# Patient Record
Sex: Male | Born: 1950 | Race: Black or African American | Hispanic: No | Marital: Married | Smoking: Former smoker
Health system: Southern US, Community
[De-identification: ages and names within clinical notes are randomized; demographics above are authoritative.]

## PROBLEM LIST (undated history)

## (undated) DIAGNOSIS — I219 Acute myocardial infarction, unspecified: Secondary | ICD-10-CM

## (undated) DIAGNOSIS — E119 Type 2 diabetes mellitus without complications: Secondary | ICD-10-CM

## (undated) DIAGNOSIS — K859 Acute pancreatitis without necrosis or infection, unspecified: Secondary | ICD-10-CM

## (undated) DIAGNOSIS — M549 Dorsalgia, unspecified: Secondary | ICD-10-CM

## (undated) DIAGNOSIS — G8929 Other chronic pain: Secondary | ICD-10-CM

## (undated) DIAGNOSIS — I10 Essential (primary) hypertension: Secondary | ICD-10-CM

## (undated) DIAGNOSIS — C73 Malignant neoplasm of thyroid gland: Secondary | ICD-10-CM

## (undated) DIAGNOSIS — I639 Cerebral infarction, unspecified: Secondary | ICD-10-CM

## (undated) HISTORY — PX: THYROIDECTOMY: SHX17

## (undated) HISTORY — PX: KNEE SURGERY: SHX244

## (undated) HISTORY — PX: PANCREAS SURGERY: SHX731

## (undated) HISTORY — PX: LUMBAR EPIDURAL INJECTION: SHX1980

## (undated) HISTORY — PX: ANGIOPLASTY: SHX39

---

## 2006-06-10 ENCOUNTER — Emergency Department (HOSPITAL_COMMUNITY): Admission: EM | Admit: 2006-06-10 | Discharge: 2006-06-10 | Payer: Self-pay | Admitting: Emergency Medicine

## 2006-07-02 ENCOUNTER — Emergency Department (HOSPITAL_COMMUNITY): Admission: EM | Admit: 2006-07-02 | Discharge: 2006-07-03 | Payer: Self-pay | Admitting: Emergency Medicine

## 2006-10-28 ENCOUNTER — Emergency Department (HOSPITAL_COMMUNITY): Admission: EM | Admit: 2006-10-28 | Discharge: 2006-10-28 | Payer: Self-pay | Admitting: *Deleted

## 2007-02-17 ENCOUNTER — Emergency Department (HOSPITAL_COMMUNITY): Admission: EM | Admit: 2007-02-17 | Discharge: 2007-02-17 | Payer: Self-pay | Admitting: *Deleted

## 2007-04-29 ENCOUNTER — Encounter
Admission: RE | Admit: 2007-04-29 | Discharge: 2007-07-28 | Payer: Self-pay | Admitting: Physical Medicine & Rehabilitation

## 2007-04-30 ENCOUNTER — Ambulatory Visit: Payer: Self-pay | Admitting: Physical Medicine & Rehabilitation

## 2007-06-12 ENCOUNTER — Ambulatory Visit: Payer: Self-pay | Admitting: Physical Medicine & Rehabilitation

## 2007-08-09 ENCOUNTER — Encounter
Admission: RE | Admit: 2007-08-09 | Discharge: 2007-11-07 | Payer: Self-pay | Admitting: Physical Medicine & Rehabilitation

## 2007-08-09 ENCOUNTER — Ambulatory Visit: Payer: Self-pay | Admitting: Physical Medicine & Rehabilitation

## 2007-09-11 ENCOUNTER — Ambulatory Visit: Payer: Self-pay | Admitting: Physical Medicine & Rehabilitation

## 2007-10-13 ENCOUNTER — Emergency Department (HOSPITAL_COMMUNITY): Admission: EM | Admit: 2007-10-13 | Discharge: 2007-10-13 | Payer: Self-pay | Admitting: Emergency Medicine

## 2007-10-14 ENCOUNTER — Ambulatory Visit: Payer: Self-pay | Admitting: Physical Medicine & Rehabilitation

## 2007-11-08 ENCOUNTER — Encounter
Admission: RE | Admit: 2007-11-08 | Discharge: 2007-11-12 | Payer: Self-pay | Admitting: Physical Medicine & Rehabilitation

## 2007-11-12 ENCOUNTER — Ambulatory Visit: Payer: Self-pay | Admitting: Physical Medicine & Rehabilitation

## 2007-12-08 ENCOUNTER — Emergency Department (HOSPITAL_COMMUNITY): Admission: EM | Admit: 2007-12-08 | Discharge: 2007-12-08 | Payer: Self-pay | Admitting: Emergency Medicine

## 2008-01-20 ENCOUNTER — Encounter
Admission: RE | Admit: 2008-01-20 | Discharge: 2008-01-21 | Payer: Self-pay | Admitting: Physical Medicine & Rehabilitation

## 2008-01-21 ENCOUNTER — Ambulatory Visit: Payer: Self-pay | Admitting: Physical Medicine & Rehabilitation

## 2008-04-10 ENCOUNTER — Encounter
Admission: RE | Admit: 2008-04-10 | Discharge: 2008-07-09 | Payer: Self-pay | Admitting: Physical Medicine & Rehabilitation

## 2008-04-14 ENCOUNTER — Ambulatory Visit: Payer: Self-pay | Admitting: Physical Medicine & Rehabilitation

## 2008-05-10 ENCOUNTER — Emergency Department (HOSPITAL_BASED_OUTPATIENT_CLINIC_OR_DEPARTMENT_OTHER): Admission: EM | Admit: 2008-05-10 | Discharge: 2008-05-10 | Payer: Self-pay | Admitting: Emergency Medicine

## 2008-05-31 ENCOUNTER — Emergency Department (HOSPITAL_BASED_OUTPATIENT_CLINIC_OR_DEPARTMENT_OTHER): Admission: EM | Admit: 2008-05-31 | Discharge: 2008-05-31 | Payer: Self-pay | Admitting: Emergency Medicine

## 2008-07-28 ENCOUNTER — Ambulatory Visit: Payer: Self-pay | Admitting: Physical Medicine & Rehabilitation

## 2008-07-28 ENCOUNTER — Encounter
Admission: RE | Admit: 2008-07-28 | Discharge: 2008-07-28 | Payer: Self-pay | Admitting: Physical Medicine & Rehabilitation

## 2008-10-14 ENCOUNTER — Ambulatory Visit: Payer: Self-pay | Admitting: Diagnostic Radiology

## 2008-10-14 ENCOUNTER — Emergency Department (HOSPITAL_BASED_OUTPATIENT_CLINIC_OR_DEPARTMENT_OTHER): Admission: EM | Admit: 2008-10-14 | Discharge: 2008-10-14 | Payer: Self-pay | Admitting: Emergency Medicine

## 2008-10-27 ENCOUNTER — Encounter
Admission: RE | Admit: 2008-10-27 | Discharge: 2008-10-29 | Payer: Self-pay | Admitting: Physical Medicine & Rehabilitation

## 2008-10-29 ENCOUNTER — Ambulatory Visit: Payer: Self-pay | Admitting: Physical Medicine & Rehabilitation

## 2009-01-06 ENCOUNTER — Emergency Department (HOSPITAL_BASED_OUTPATIENT_CLINIC_OR_DEPARTMENT_OTHER): Admission: EM | Admit: 2009-01-06 | Discharge: 2009-01-06 | Payer: Self-pay | Admitting: Emergency Medicine

## 2009-03-06 ENCOUNTER — Emergency Department (HOSPITAL_BASED_OUTPATIENT_CLINIC_OR_DEPARTMENT_OTHER): Admission: EM | Admit: 2009-03-06 | Discharge: 2009-03-06 | Payer: Self-pay | Admitting: Emergency Medicine

## 2009-03-22 ENCOUNTER — Encounter
Admission: RE | Admit: 2009-03-22 | Discharge: 2009-03-26 | Payer: Self-pay | Admitting: Physical Medicine & Rehabilitation

## 2009-03-26 ENCOUNTER — Ambulatory Visit: Payer: Self-pay | Admitting: Physical Medicine & Rehabilitation

## 2009-06-30 ENCOUNTER — Ambulatory Visit: Payer: Self-pay | Admitting: Diagnostic Radiology

## 2009-06-30 ENCOUNTER — Emergency Department (HOSPITAL_BASED_OUTPATIENT_CLINIC_OR_DEPARTMENT_OTHER): Admission: EM | Admit: 2009-06-30 | Discharge: 2009-06-30 | Payer: Self-pay | Admitting: Emergency Medicine

## 2009-08-09 ENCOUNTER — Ambulatory Visit: Payer: Self-pay | Admitting: Physical Medicine & Rehabilitation

## 2009-08-09 ENCOUNTER — Encounter
Admission: RE | Admit: 2009-08-09 | Discharge: 2009-11-07 | Payer: Self-pay | Admitting: Physical Medicine & Rehabilitation

## 2010-06-09 ENCOUNTER — Emergency Department (HOSPITAL_COMMUNITY): Admission: EM | Admit: 2010-06-09 | Discharge: 2010-06-09 | Payer: Self-pay | Admitting: Emergency Medicine

## 2010-06-11 ENCOUNTER — Emergency Department (HOSPITAL_BASED_OUTPATIENT_CLINIC_OR_DEPARTMENT_OTHER): Admission: EM | Admit: 2010-06-11 | Discharge: 2010-06-11 | Payer: Self-pay | Admitting: Emergency Medicine

## 2010-06-11 ENCOUNTER — Ambulatory Visit: Payer: Self-pay | Admitting: Radiology

## 2010-10-18 LAB — CBC
MCH: 23.6 pg — ABNORMAL LOW (ref 26.0–34.0)
MCH: 23.8 pg — ABNORMAL LOW (ref 26.0–34.0)
MCHC: 32 g/dL (ref 30.0–36.0)
MCV: 71 fL — ABNORMAL LOW (ref 78.0–100.0)
Platelets: 215 10*3/uL (ref 150–400)
Platelets: 217 10*3/uL (ref 150–400)
RBC: 5.55 MIL/uL (ref 4.22–5.81)
RDW: 14.6 % (ref 11.5–15.5)
RDW: 14.3 % (ref 11.5–15.5)

## 2010-10-18 LAB — POCT I-STAT, CHEM 8
BUN: 13 mg/dL (ref 6–23)
Calcium, Ion: 1.07 mmol/L — ABNORMAL LOW (ref 1.12–1.32)
Creatinine, Ser: 1.4 mg/dL (ref 0.4–1.5)
Glucose, Bld: 96 mg/dL (ref 70–99)
TCO2: 32 mmol/L (ref 0–100)

## 2010-10-18 LAB — BASIC METABOLIC PANEL
BUN: 16 mg/dL (ref 6–23)
CO2: 31 mEq/L (ref 19–32)
Calcium: 9.6 mg/dL (ref 8.4–10.5)
Creatinine, Ser: 1.3 mg/dL (ref 0.4–1.5)
GFR calc Af Amer: >60 mL/min (ref >60)
Glucose, Bld: 83 mg/dL (ref 70–99)

## 2010-10-18 LAB — POCT CARDIAC MARKERS: Myoglobin, poc: 92 ng/mL (ref 12–200)

## 2010-10-18 LAB — URINE MICROSCOPIC-ADD ON

## 2010-10-18 LAB — URINALYSIS, ROUTINE W REFLEX MICROSCOPIC
Leukocytes, UA: NEGATIVE
Nitrite: NEGATIVE
Specific Gravity, Urine: 1.022 (ref 1.005–1.030)
Urobilinogen, UA: 0.2 mg/dL (ref 0.0–1.0)

## 2010-10-18 LAB — DIFFERENTIAL
Basophils Absolute: 0.1 10*3/uL (ref 0.0–0.1)
Basophils Relative: 2 % — ABNORMAL HIGH (ref 0–1)
Eosinophils Absolute: 0.1 10*3/uL (ref 0.0–0.7)
Eosinophils Relative: 2 % (ref 0–5)
Lymphs Abs: 2.4 10*3/uL (ref 0.7–4.0)
Monocytes Relative: 11 % (ref 3–12)
Neutro Abs: 2.3 10*3/uL (ref 1.7–7.7)
Neutrophils Relative %: 41 % — ABNORMAL LOW (ref 43–77)

## 2010-10-18 LAB — HEPATIC FUNCTION PANEL
AST: 27 U/L (ref 0–37)
Bilirubin, Direct: 0.1 mg/dL (ref 0.0–0.3)
Total Bilirubin: 0.6 mg/dL (ref 0.3–1.2)

## 2010-10-18 LAB — LIPASE, BLOOD: Lipase: 34 U/L (ref 11–59)

## 2010-11-09 LAB — HEPATIC FUNCTION PANEL
Alkaline Phosphatase: 75 U/L (ref 39–117)
Bilirubin, Direct: 0 mg/dL (ref 0.0–0.3)
Indirect Bilirubin: 0.8 mg/dL (ref 0.3–0.9)
Total Protein: 8.9 g/dL — ABNORMAL HIGH (ref 6.0–8.3)

## 2010-11-09 LAB — BASIC METABOLIC PANEL
CO2: 28 mEq/L (ref 19–32)
Calcium: 9.7 mg/dL (ref 8.4–10.5)
Chloride: 98 mEq/L (ref 96–112)
Creatinine, Ser: 1.4 mg/dL (ref 0.4–1.5)
Glucose, Bld: 102 mg/dL — ABNORMAL HIGH (ref 70–99)
Sodium: 142 mEq/L (ref 135–145)

## 2010-11-09 LAB — POCT B-TYPE NATRIURETIC PEPTIDE (BNP): B Natriuretic Peptide, POC: 20 pg/mL (ref 0–100)

## 2010-11-09 LAB — CBC
Hemoglobin: 13.9 g/dL (ref 13.0–17.0)
MCHC: 32.3 g/dL (ref 30.0–36.0)
MCV: 76.6 fL — ABNORMAL LOW (ref 78.0–100.0)
RDW: 13.8 % (ref 11.5–15.5)

## 2010-11-09 LAB — DIFFERENTIAL
Basophils Absolute: 0.1 10*3/uL (ref 0.0–0.1)
Basophils Relative: 2 % — ABNORMAL HIGH (ref 0–1)
Eosinophils Absolute: 0.2 10*3/uL (ref 0.0–0.7)
Monocytes Absolute: 0.6 10*3/uL (ref 0.1–1.0)
Monocytes Relative: 9 % (ref 3–12)
Neutro Abs: 3.2 10*3/uL (ref 1.7–7.7)

## 2010-11-09 LAB — POCT CARDIAC MARKERS
CKMB, poc: 2.4 ng/mL (ref 1.0–8.0)
Myoglobin, poc: 164 ng/mL (ref 12–200)

## 2010-11-09 LAB — LIPASE, BLOOD: Lipase: 105 U/L (ref 23–300)

## 2010-11-13 LAB — CBC
HCT: 44.5 % (ref 39.0–52.0)
MCHC: 30.6 g/dL (ref 30.0–36.0)
MCV: 77.2 fL — ABNORMAL LOW (ref 78.0–100.0)
RBC: 5.77 MIL/uL (ref 4.22–5.81)
WBC: 5.3 10*3/uL (ref 4.0–10.5)

## 2010-11-13 LAB — LIPASE, BLOOD: Lipase: 81 U/L (ref 23–300)

## 2010-11-13 LAB — COMPREHENSIVE METABOLIC PANEL
AST: 42 U/L — ABNORMAL HIGH (ref 0–37)
BUN: 12 mg/dL (ref 6–23)
CO2: 34 mEq/L — ABNORMAL HIGH (ref 19–32)
Calcium: 9.7 mg/dL (ref 8.4–10.5)
Chloride: 97 mEq/L (ref 96–112)
Creatinine, Ser: 1.3 mg/dL (ref 0.4–1.5)
GFR calc Af Amer: >60 mL/min (ref >60)
GFR calc non Af Amer: 57 mL/min — ABNORMAL LOW (ref >60)
Total Bilirubin: 0.2 mg/dL — ABNORMAL LOW (ref 0.3–1.2)

## 2010-11-13 LAB — DIFFERENTIAL
Basophils Absolute: 0.1 10*3/uL (ref 0.0–0.1)
Eosinophils Relative: 2 % (ref 0–5)
Lymphocytes Relative: 47 % — ABNORMAL HIGH (ref 12–46)
Lymphs Abs: 2.5 10*3/uL (ref 0.7–4.0)
Neutro Abs: 2 10*3/uL (ref 1.7–7.7)
Neutrophils Relative %: 39 % — ABNORMAL LOW (ref 43–77)

## 2010-11-17 LAB — BASIC METABOLIC PANEL
BUN: 21 mg/dL (ref 6–23)
Chloride: 97 mEq/L (ref 96–112)
GFR calc Af Amer: >60 mL/min (ref >60)
GFR calc non Af Amer: 57 mL/min — ABNORMAL LOW (ref >60)
Potassium: 3.1 mEq/L — ABNORMAL LOW (ref 3.5–5.1)
Sodium: 140 mEq/L (ref 135–145)

## 2010-11-17 LAB — POCT CARDIAC MARKERS
Myoglobin, poc: 106 ng/mL (ref 12–200)
Myoglobin, poc: 91.3 ng/mL (ref 12–200)
Troponin i, poc: <0.05 ng/mL (ref 0.00–0.09)
Troponin i, poc: <0.05 ng/mL (ref 0.00–0.09)

## 2010-11-17 LAB — CBC
HCT: 41.2 % (ref 39.0–52.0)
MCV: 77.1 fL — ABNORMAL LOW (ref 78.0–100.0)
Platelets: 219 10*3/uL (ref 150–400)
RBC: 5.34 MIL/uL (ref 4.22–5.81)
WBC: 4.8 10*3/uL (ref 4.0–10.5)

## 2010-11-17 LAB — DIFFERENTIAL
Eosinophils Relative: 3 % (ref 0–5)
Lymphocytes Relative: 42 % (ref 12–46)
Lymphs Abs: 2.1 10*3/uL (ref 0.7–4.0)
Monocytes Relative: 12 % (ref 3–12)

## 2010-11-29 ENCOUNTER — Emergency Department (HOSPITAL_BASED_OUTPATIENT_CLINIC_OR_DEPARTMENT_OTHER)
Admission: EM | Admit: 2010-11-29 | Discharge: 2010-11-29 | Disposition: A | Discharge status: Home / Self Care | Payer: MEDICARE | Attending: Emergency Medicine | Admitting: Emergency Medicine

## 2010-11-29 DIAGNOSIS — F17200 Nicotine dependence, unspecified, uncomplicated: Secondary | ICD-10-CM | POA: Insufficient documentation

## 2010-11-29 DIAGNOSIS — I252 Old myocardial infarction: Secondary | ICD-10-CM | POA: Insufficient documentation

## 2010-11-29 DIAGNOSIS — R1013 Epigastric pain: Secondary | ICD-10-CM | POA: Insufficient documentation

## 2010-11-29 DIAGNOSIS — K861 Other chronic pancreatitis: Secondary | ICD-10-CM | POA: Insufficient documentation

## 2010-11-29 DIAGNOSIS — Z8679 Personal history of other diseases of the circulatory system: Secondary | ICD-10-CM | POA: Insufficient documentation

## 2010-11-29 DIAGNOSIS — I10 Essential (primary) hypertension: Secondary | ICD-10-CM | POA: Insufficient documentation

## 2010-11-29 LAB — COMPREHENSIVE METABOLIC PANEL
BUN: 14 mg/dL (ref 6–23)
Calcium: 8.6 mg/dL (ref 8.4–10.5)
Glucose, Bld: 99 mg/dL (ref 70–99)
Total Protein: 8 g/dL (ref 6.0–8.3)

## 2010-11-29 LAB — LIPASE, BLOOD: Lipase: 124 U/L (ref 23–300)

## 2010-11-29 LAB — CBC
HCT: 37.3 % — ABNORMAL LOW (ref 39.0–52.0)
MCHC: 34 g/dL (ref 30.0–36.0)
MCV: 67.6 fL — ABNORMAL LOW (ref 78.0–100.0)
Platelets: 195 10*3/uL (ref 150–400)
RDW: 15.4 % (ref 11.5–15.5)

## 2010-12-20 NOTE — Assessment & Plan Note (Signed)
Robert Meadows returns today.  He has chronic abdominal pain and chronic  pancreatitis but also improvement with trigger point injections  indicating a myofascial component to his pain.   INTERVAL MEDICAL HISTORY:  Left knee still doing okay after PT and  injection, however starting to get some left foot pain at the heel area.  He has seen podiatry in the past, has had foot orthosis in the past,  does not have one currently.   REVIEW OF SYSTEMS:  Has had some nausea and vomiting and abdominal pain  recently.   He does follow with his primary care physician as well.   VITAL SIGNS:  Currently his blood pressure is 156/90, pulse 69,  respiratory rate is 18, O2 saturation 99% in room air.  GENERAL:  In no acute distress, mood and affect appropriate.  ABDOMEN:  Has tenderness to palpation in the rectus abdominis area which  is exacerbated by his general health.  He otherwise has negative  guarding, rebound, no ascites noted.  No epigastric tenderness noted.  No lower quadrant tenderness noted.  LOWER EXTREMITIES:  In looking at his foot he has tenderness over the  plantar surface of the calcaneus.  He has a tight Achilles and has no  significant exacerbation of his pain with passive toe extension on the  left side.  No evident joint swelling, no foot edema, his foot is warm,  dry, no lesions.   IMPRESSION:  1. Chronic abdominal pain, a combination of chronic pancreatitis as      well as more of a rectus abdominis myofascial pain syndrome.  2. Left Achilles tendinitis.   PLAN:  1. Send to physical therapy for ultrasound as well as stretching and      assessment for foot orthosis versus heel cushion.  2. Trigger point injection today and once again, if temporarily      successful, he may benefit from botulinum toxin injection in the      rectus abdominis musculature.  3. Intrusive knee osteoarthritis, does not need a repeat injection at      this time.  Will follow up in 1  month.      Robert Meadows, M.D.  Electronically Signed     AEK/MedQ  D:  08/12/2007 16:27:41  T:  08/12/2007 20:23:07  Job #:  [id number]   cc:   Rocky Morel, M.D.

## 2010-12-20 NOTE — Procedures (Signed)
NAME:  Robert Meadows, Robert Meadows NO.:  [id number]   MEDICAL RECORD NO.:  [id number]          PATIENT TYPE:  REC   LOCATION:  [facility]                          FACILITY:  [facility]   PHYSICIAN:  [name], M.D.DATE OF BIRTH:  June 18, 1951   DATE OF PROCEDURE:  DATE OF DISCHARGE:                               OPERATIVE REPORT   PROCEDURE:  Right upper rectus abdominis botulinum toxin injection.   INDICATIONS:  Muscle spasm and pain, 728.85.  Pain is only partially  responsive to medication management, interferes with self-care mobility.  He has had temporary improvements with lidocaine a injection in the same  area lasting about 2 weeks.   Dilution of Botox is 50 units/mL, total medication 100 units.   Informed consent was obtained after describing risks and benefits of the  procedure to the patient.  These include bleeding, bruising, infection  as well as bowel perforation.  He elects to proceed and has given  written consent.  The patient placed supine on exam table.  The area of  maximal pain was palpated, upper right rectus abdominis area was marked  with marking pen and prepped with Betadine and alcohol, then entered  with a 26-gauge 1-inch needle electrode under needle EMG guidance.  Appropriate EMG activity obtained at each of two sites, 50 units were  injected into each site.  The patient tolerated the procedure well.  Post injection instructions given.  Return in 1 month for follow-up  visit.      [name], M.D.  Electronically Signed     AEK/MEDQ  D:  10/15/2007 14:06:06  T:  10/16/2007 14:22:11  Job:  [id number]

## 2010-12-20 NOTE — Group Therapy Note (Signed)
Consult requested for pain management for chronic abdominal pain due to  chronic pancreatitis.   HISTORY:  The patient is a 60 year old male who has had a fairly  constant stabbing and aching pain in his abdominal area since November  2006.  He states that he had a Thanksgiving meal and developed nausea  and vomiting.  He was reportedly admitted to the hospital and diagnosed  with pancreatitis.  I do not have records from that original  hospitalization.  He has been seen by several gastroenterologists,  including Dr. Harlen Meadows, who diagnosed a high probability of chronic  pancreatitis by endoscopy and endoscopic ultrasound.  He has had  ultrasound-guided celiac plexus block which was not helpful.  He has  another appointment with Dr. Margaretha Meadows next month.  He has had an ER visit  at [facility] following an ERCP on November 07, 2006 and had followup from his  celiac plexus block on February 04, 2007, and at that point it sounded like  although initially he had some increased pain after the celiac plexus  block, he ended up having a good result, and another one was performed  on February 15, 2007.   From a medication management standpoint, he has had hydrocodone  prescribed, and he takes this 2 tablets twice a day, which the patient  states is helpful when he takes this much, but he states that he usually  tries to stretch out his medicine and use 1 at a time, and supplements  with Tylenol.  He does note that he sometimes takes up to 12 Tylenol per  day, however.  His review of systems is positive for nausea, vomiting,  constipation, abdominal pain, poor appetite, as well as tremor.  His  functional status is independent.  He is able to climb steps.  He  drives.  He does not use any type of assistive device.   PAST MEDICAL HISTORY:  1. Hypertension.  2. Myocardial infarction.  3. Diabetes mellitus.  4. He has had a stroke in the past as well.   MEDICATIONS:  1. Imipramine 1 tablet per night.  2.  Clonidine 0.2 daily.  3. Zocor 10 mg a day.  4. Nitroglycerin p.r.n.  5. Dicyclomine 10 mg q.i.d.  6. Proctozone 2.5 twice a day.  7. Hydrocodone as needed 5/500.  8. Promethazine 25 mg as needed.  9. Klor-Con 20 mEq a day.  10.Isosorbide dinitrate 30 mg 1/2 tablet per day.  11.Atenolol 100 mg per day.  12.Furosemide 20 mg a day.  13.Nexium 40 mg a day.  14.Cozaar 100 mg a day.  15.Aspirin 81 mg a day.  16.Pangestyme t.i.d.   OTHER TREATING PHYSICIANS:  1. Dr. Judithe Modest, who is I believe his cardiologist.  2. Dr. Sherilyn Dacosta.   His last hydrocodone was taken April 28, 2007.  He states he did not  take his medicine today because he wanted to show me how bad he gets.  He has received oxycodone in the hospital.  This has been helpful in the  past.  He has had Dilaudid in the past, which was helpful, but he states  that morphine has not helped him.   PHYSICAL EXAMINATION:  VITAL SIGNS:  Blood pressure 190/120, pulse 82,  respiratory rate 18, O2 saturation 100% on room air.  GENERAL:  No acute distress.  Mood and affect appropriate.  MUSCULOSKELETAL:  His back has mild tenderness to palpation.  Neck has  full range of motion.  Lumbar spine has good range of  motion.  Upper and  lower extremity strength is normal.  Deep tendon reflexes are normal.  Gait is normal.  No toe drag or knee instability.  ABDOMEN:  Has mild upper quadrant tenderness to palpation, left greater  than right side, mainly around the costal margin.  He has no rebound or  guarding.  His bowel sounds are positive.  LUNGS:  Clear.  HEART:  Regular rate and rhythm.   IMPRESSION:  1. Chronic abdominal pain due to chronic pancreatitis.  Overall, I      think he has a good indication for narcotic analgesic medications.      However, we will need to check urine drug screen to assess for      concomitant illicit drug use or nondisclosed opiates potentially      from other sources.  This will take approximately a week or so  to      come back.  He has enough Vicodin, if he takes 2 tablets twice a      day, for another 8 days.  2. I have also prescribed outpatient PT for him twice a week x3 weeks.      He does have a myofascial component to his pain which is at the      costal margin origin of the rectus abdominis.  Will use lidocaine      as well for this, 2 patches on 12, off 12.  3. His blood pressure is elevated 190/120.  We called Dr. Irma Newness      office, who will see him today and further assess.  While his pain      may be a component of his elevated blood pressure, he also has a      history of hypertension.  He is on multiple medications, with prior      history of MI.      [name], M.D.  Electronically Signed     AEK/MedQ  D:  04/30/2007 16:36:38  T:  05/01/2007 11:13:30  Job #:  [id number]   cc:   Robert Meadows, M.D.  Fax [phone]   Robert Meadows, M.D.

## 2010-12-20 NOTE — Assessment & Plan Note (Signed)
A 60 year old male with stabbing aching pain, abdominal area since  November of 2006.  He was diagnosed with chronic pancreatitis.  Temporary result with ultrasound-guided celiac plexus spot seen by me in  initial evaluation in December.  Urine drug screen was appropriate.  No  evidence of illicit drug use.  No inappropriate opiate abuse.  He had  physical therapy for back pain and abdominal pain.  He has done well  with this and has finished up.  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   His knee pain is improved after injections on the left knee.   GENERAL:  No acute distress.  Mood and affect flat.  Average pain is in  the 3/10 level, which is up a little from last week was fair.  He had some nausea and vomiting the other day.  He has no further shoulder discomfort nor pain with range of motion in  his knee.  He has pain-free range of motion.  No evidence of perfusion.  Lower extremity strength is good.  BACK:  He has no tenderness to palpation.  ABDOMEN:  Positive bowel sounds and soft.  Nontender.  NECK:  Thyroid incision, transverse, healing well.   IMPRESSION:  1. Chronic abdominal pain, chronic pancreatitis.  Continue hydrocodone      5/500 b.i.d.  2. Left knee osteoarthritis with good results with injections.      Finished up with physical therapy.  Encouraged continued walking.   PLAN:  I will see him back in 1 month.  Continue the hydrocodone at  current dosage.  May have some exacerbation depending on his overall  health status.      [name], M.D.  Electronically Signed     AEK/MedQ  D:  06/13/2007 08:33:13  T:  06/13/2007 11:31:59  Job #:  [id number]   cc:   Dr. Rocky Morel  [place], [place]

## 2010-12-20 NOTE — Procedures (Signed)
NAME:  Robert Meadows, Robert Meadows NO.:  [id number]   MEDICAL RECORD NO.:  [id number]          PATIENT TYPE:  REC   LOCATION:  [facility]                          FACILITY:  [facility]   PHYSICIAN:  [name], M.D.DATE OF BIRTH:  1950-12-02   DATE OF PROCEDURE:  08/12/2007  DATE OF DISCHARGE:                               OPERATIVE REPORT   [name] returns today for trigger point injection.  He had an least  1 week of improvement after the right upper rectus abdominis was  injected on July 11, 2007.  His pain is only partially responsive to  medication management and other conservative care.  He has already gone  through physical therapy.   Informed consent was obtained after describing the risks and benefits of  the procedure to the patient.  These including bruising and infection.  He elects to proceed and has given consent.  The patient placed supine  on exam table.  Area marked and prepped with Betadine, entered with 25-  gauge inch and a half needle.  Three areas in the right upper rectus  abdominis at the midpoint mediolaterally marked, prepped with Betadine,  entered and injected with 1 mL of 1% lidocaine into each site.  The  patient tolerated the procedure well.  Pre and post injection  instructions given.      [name], M.D.  Electronically Signed     AEK/MEDQ  D:  08/12/2007 16:23:34  T:  08/13/2007 06:46:07  Job:  [id number]

## 2010-12-20 NOTE — Assessment & Plan Note (Signed)
The patient was last seen by me, January 21, 2008.  He has a history of  chronic pancreatitis, chronic abdominal pain, has responded to trigger-  point injection temporarily as well as Botox injection.  He has been  cleared for four injections for this year.  He has received one of them.  This is worn off.  He has had pain, only partial response to narcotic  analgesics as well as physical therapy.   His abdomen has some tenderness to right upper quadrant over the rectus  abdominis musculature.  No rebound, no guarding.  He is able to do  single and double leg raise, although this causes pain in his upper  abdominal area.   We discussed medications, he likes to resume his narcotic analgesics.  I  last gave him medication on January 15, 2008.  He states that he has been  in the hospital couple of times, received medicines post-hospitalization  as well.   His Oswestry disability index is 48%, putting him into moderate-to-  severe range.   IMPRESSION:  1. Chronic abdominal pain with partial relief with narcotic analgesic      medications, has had hospitalization x3 to 4 with chronic      pancreatitis with acute flare ups.  His last botulinum toxin      injection has been several months ago, in fact last one was      performed in March and he is about 6 months post.   We will repeat today.  1. Resumed hydrocodone.  2. Urine drug screen.      [name], M.D.  Electronically Signed     AEK/MedQ  D:  04/14/2008 13:51:10  T:  04/15/2008 03:30:41  Job #:  [id number]

## 2010-12-20 NOTE — Procedures (Signed)
NAME:  Robert Meadows, Robert Meadows NO.:  [id number]   MEDICAL RECORD NO.:  [id number]           PATIENT TYPE:   LOCATION:                                 FACILITY:   PHYSICIAN:  [name], M.D.DATE OF BIRTH:  1951/08/01   DATE OF PROCEDURE:  07/28/2008  DATE OF DISCHARGE:                               OPERATIVE REPORT   Botox injection, upper abdominal rectus on the right.   INDICATION:  Chronic muscle spasm with severe pain only partially  relieved by medication management and stretching.   Area was marked and prepped with Betadine, entered with 26-gauge, 50-mm  needle under EMG guidance.  Appropriate EMG activity obtained.  Botox 50  units were injected into each of 2 sites that were premarked and prepped  with Betadine.  Injection done after negative drawback for blood.  The  patient tolerated the procedure well.  We will repeat in 3 months and  has had good 53-month relief with prior injections.  Total units injected  100 units.  Informed consent was obtained.      [name], M.D.  Electronically Signed     AEK/MEDQ  D:  07/28/2008 16:10:96  T:  07/28/2008 20:40:08  Job:  [id number]

## 2010-12-20 NOTE — Procedures (Signed)
NAME:  DAYMIAN, LILL NO.:  [id number]   MEDICAL RECORD NO.:  [id number]          PATIENT TYPE:  REC   LOCATION:  [facility]                          FACILITY:  [facility]   PHYSICIAN:  [name], M.D.DATE OF BIRTH:  [birth year]   DATE OF PROCEDURE:  09/12/2007  DATE OF DISCHARGE:                               OPERATIVE REPORT   PROCEDURE PERFORMED:  Right rectus abdominis trigger point injection.   INDICATIONS:  Chronic muscle spasm with pain right upper rectus  abdominis.  The pain is only partially responsive to medication  management and other conservative care.  He has gone through physical  therapy and takes narcotic analgesics.  He had good relief for about two  weeks with a prior injection on August 12, 2007.  This resulted in not  having to use his hydrocodone or his Lidoderm patch.   PROCEDURE:  Informed consent was obtained after describing the risks and  benefits of the procedure to the patient.  These include bleeding,  bruising, infection. He elects to proceed and has given written consent.  The patient was placed supine on the exam table.  The area was marked  and prepped with Betadine. Entered with a 25 gauge 1 1/2 inch needle.  Three areas in the upper rectus abdominis marked, prepped with Betadine,  entered and injected with 1 mL of 1% lidocaine into each site in a fan  like pattern.  The patient tolerated the procedure well.  All injections  done after negative drawback for blood.  Pre and post injection  instructions given.   Should he have similar relief with this set of injections, I would  recommend botulinum toxin injection for more prolonged effect.      [name], M.D.  Electronically Signed     AEK/MEDQ  D:  09/12/2007 09:54:33  T:  09/12/2007 23:55:33  Job:  [id number]

## 2010-12-20 NOTE — Procedures (Signed)
NAME:  Robert Meadows, PORTNER NO.:  [id number]   MEDICAL RECORD NO.:  [id number]          PATIENT TYPE:  REC   LOCATION:  [facility]                          FACILITY:  [facility]   PHYSICIAN:  [name], M.D.DATE OF BIRTH:  10/27/1950   DATE OF PROCEDURE:  09/12/2007  DATE OF DISCHARGE:                               OPERATIVE REPORT   PROCEDURE:  Left knee intra-articular injection.   INDICATIONS:  Left knee osteoarthritis only partially relieved with  physical therapy and medication management. The pain interferes with  ambulation and contributes to right foot pain.  He has had previous good  relief with intra-articular injection of the knee which was last  performed May 15, 2007.   Informed consent was obtained after describing the risks and benefits to  the patient.  These include bleeding, bruising, infection, and he elects  to proceed and has given written consent. The patient was placed supine  on the exam table.  The area on the medial aspect of the knee was marked  and prepped with Betadine as well as alcohol, entered with a 25 gauge 1  1/2 inch needle.  A solution containing 1 mL of 40 mg/mL Kenalog plus 4  mL of 1% lidocaine were injected after negative drawback for blood.  The  patient tolerated the procedure well.  Post injection instructions  given.      [name], M.D.  Electronically Signed     AEK/MEDQ  D:  09/12/2007 09:32:53  T:  09/12/2007 21:48:32  Job:  [id number]

## 2010-12-20 NOTE — Assessment & Plan Note (Signed)
Robert Meadows follows up today.  He was last seen by me November 13, 2007.  He  did not follow through with physical therapy to do abdominal stretching  exercise, strengthening exercise but resumed his prior home exercise  program, which he thinks may be helping somewhat.  He has had an  exacerbation of his abdominal pain.  He was seen at ED by Dr. Dianne Dun.  He was seen on Dec 08, 2007, had a normal CMET, normal bili and alk phos,  normal lipase, normal cardiac enzyme, normal urine, and CT of the  abdomen showed no acute findings.  No ureteral calculi.  No CT evidence  for pancreatitis.  He was given prescription for oxycodone and  acetaminophen, which I was not aware of until after his visit with me  today.   His current meds from me include hydrocodone 5/500 one p.o. b.i.d.   PHYSICAL EXAMINATION:  ABDOMEN:  Positive bowel sounds.  Soft and  nontender to palpation.  No guarding.  No rebound.   He also complains of some heel pain.  We checked him for that.  He does  have evidence of plantar fasciitis with pain with stretching of the  plantar fascia as well as point tenderness over the calcaneus distal  aspect.   IMPRESSION:  1. Chronic pancreatitis with chronic abdominal pain.  2. Plantar fasciitis.   PLAN:  1. We will send him for orthotics for his plantar fasciitis.  2. We would like him to follow up with GI since he has not seen GI for      about 8 to 9 months.  I am not convinced, however, the last      flareup was actually pancreatitis.  3. He did violate his narcotic substance agreement.  We will check UDS      and consider discharge from clinic.      [name], M.D.  Electronically Signed     AEK/MedQ  D:  01/21/2008 16:56:55  T:  01/22/2008 09:22:30  Job #:  [id number]   cc:   Rocky Morel, MD  [place], [place]   Harlen Labs, MD  Department of GI  [facility]

## 2010-12-20 NOTE — Assessment & Plan Note (Signed)
A 60 year old male with stabbing aching pain in the abdominal area since  November 2006.  Diagnosed with chronic pancreatitis, only temporary  result with ultrasound guided celiac plexus block.  He was seen by me in  initial consultation approximately 2 weeks ago.  We checked urine drug  screen, and this turned out to be appropriate for the medications  listed.  No evidence of illicit drug use, or inappropriate opiate abuse.   Patient also has started with physical therapy for back pain and  abdominal pain, and they have been doing some core strengthening.   He returns today feeling as good as he has felt for a long time.  He did  have a colonoscopy in the interval time, polyp removed, and also a  thyroid biopsy.  He is not clear what the results are at this point.  His blood pressure was elevated last visit.  He has followed up with Dr.  Daryll Brod on this.   GENERAL:  In no acute distress.  Mood and affect appropriate.  His  average pain is about 2/10 currently.  It interferes with activity at  6/10 level.  Sleep is good.  Relief from medications is good.  He can  climb steps.  He drives.  He is independent with his self-care.   REVIEW OF SYSTEMS:  Positive for nausea, vomiting, constipation, and  reflux.   His blood pressure is 153/93.  Pulse 62.  Respirations 18.  His O2  saturation 100% on room air.  GENERAL:  In no acute distress.  Mood and affect appropriate.  Orientation x3.  Gait is normal.  BACK:  He has tenderness to palpation in the axial spine area in the  lumbar.  ABDOMEN:  Has no tenderness to palpation, and soft.  He can do single leg raises.  His left knee is painful to palpation  along the medial joint line.  He has crepitus at the knee, but good  range of motion on the left knee.  His lower extremity strength is  normal.   IMPRESSION:  1. Chronic abdominal pain, chronic pancreatitis.  Will continue      hydrocodone 5/500 b.i.d.  2. Left knee, history of  osteoarthritis.  Has had arthroscopy      demonstrating this in the past.  Has had good results with      injections.  I believe his physical therapy might be flaring up his      knee pain a bit, and we will do an injection today.  3. Chronic back pain.  This is fairly mild, and core strengthening      should help with this is as well.      [name], M.D.  Electronically Signed     AEK/MedQ  D:  05/16/2007 08:44:46  T:  05/16/2007 12:17:50  Job #:  [id number]   cc:   Rocky Morel, MD  [facility], [place]

## 2010-12-20 NOTE — Procedures (Signed)
NAME:  Robert Meadows, Robert Meadows NO.:  [id number]   MEDICAL RECORD NO.:  [id number]           PATIENT TYPE:   LOCATION:                                 FACILITY:   PHYSICIAN:  [name], M.D.DATE OF BIRTH:  Oct 14, 1950   DATE OF PROCEDURE:  DATE OF DISCHARGE:                               OPERATIVE REPORT   PROCEDURE:  Right rectus abdominus botulinum toxin injection on her  needle EMG guidance.   INDICATION:  Muscle-related pain, only partially relieved by medication  management and interfering with usual activities.   His right upper quadrant was marked and prepped with Betadine in 3  specific areas.  They were entered with 26-gauge 50-mm needle electrode  under needle EMG guidance.  Appropriate EMG activity was obtained, 100  units of Botox were divided between the 3 sites.  Dilution was 50  units/cc.  All injections done after negative drawback for blood and  after appropriate EMG activity was obtained.  Post injection  instructions given.  I will see him back in 3 months for repeat as this  has been his usual duration of effect based on prior experience.      [name], M.D.  Electronically Signed     AEK/MEDQ  D:  03/26/2009 16:36:42  T:  03/27/2009 07:38:14  Job:  [id number]

## 2010-12-20 NOTE — Procedures (Signed)
NAME:  Robert Meadows, Robert Meadows NO.:  [id number]   MEDICAL RECORD NO.:  [id number]          PATIENT TYPE:  REC   LOCATION:  [facility]                          FACILITY:  [facility]   PHYSICIAN:  [name], M.D.DATE OF BIRTH:  22-Jan-1951   DATE OF PROCEDURE:  05/16/2007  DATE OF DISCHARGE:                               OPERATIVE REPORT   PROCEDURE:  Left knee injection.   INDICATIONS FOR PROCEDURE:  Knee, OA, only partially responsive to  medication management and physical therapy.   Informed consent was obtained after describing the risks and benefits of  the procedure including bleeding, bruising, infection.  Medial approach  utilized.  Area marked, prepped with Betadine, frozen with methyl  fluoride, and then entered with 25-gauge 1-1/2-inch.  1 mL of 40 mg/mL  Depo-Medrol and 4 mL of 1% lidocaine injected.  The patient tolerated  procedure well.      [name], M.D.  Electronically Signed     AEK/MEDQ  D:  05/16/2007 08:45:47  T:  05/16/2007 13:40:23  Job:  [id number]

## 2010-12-20 NOTE — Procedures (Signed)
NAME:  Robert Meadows, Robert Meadows NO.:  [id number]   MEDICAL RECORD NO.:  [id number]          PATIENT TYPE:  REC   LOCATION:  [facility]                          FACILITY:  [facility]   PHYSICIAN:  [name], M.D.DATE OF BIRTH:  1951/03/22   DATE OF PROCEDURE:  07/11/2007  DATE OF DISCHARGE:                               OPERATIVE REPORT   PROCEDURE:  Trigger point injection right upper rectus abdominis,  sternal costal margin.   INDICATION:  Chronic abdominal pain, history of weeks of localized  tenderness in rectus abdominis muscles.   Informed consent was obtained after describing risks and benefits of  procedure including bleeding, bruising, infection.  The patient elects  proceed.  Areas marked on the inferior aspect of costal margin right  para midline rectus abdominis.  Prepped with Betadine and alcohol and  with 25 gauge 1-1/2 inch needle 1 mL 1% lidocaine injected into each  site after negative drawback for blood.  The patient tolerated procedure  well.  Post injection instructions given.  Return in 1 month.      [name], M.D.  Electronically Signed     AEK/MEDQ  D:  07/11/2007 08:56:10  T:  07/11/2007 10:19:58  Job:  [id number]

## 2010-12-20 NOTE — Assessment & Plan Note (Signed)
Office visit for left foot pain.  Has Achilles tendinitis.  Sent to  physical therapy. He is complaining about a 5/10 pain, but this subsides  after he is up walking.  He has had no lower extremity swelling, no  ankle pain, no numbness in the foot.   EXAMINATION:  Blood pressure 166/93, pulse 79, respiratory rate 18, O2  saturation 100% room air.  GENERAL:  No acute distress.  Overweight male.  Orientation x3.  Affect  bright and alert.  Gait shows no evidence of toe dragging or knee instability.  His foot  has good medial lateral stability, negative anterior drawer, negative  pain over the medial and lateral malleolar areas.  No pain with foot  inversion eversion, either passively or actively.  His ankle range of  motion is increased now to about 20 degrees.  His ankle dorsiflexion  strength has improved by one muscle grade.  He still has some tenderness  about 2 cm distal to the plantar surface of the calcaneus.  There is no  hypersensitivity to touch over that area, no foot swelling.   IMPRESSION:  Plantar fasciitis, improved.  Recommend that he continues  his last therapy session, continue his home exercise program.  If he has  residual pain after I see him again in one month, would send for foot  orthosis.      [name], M.D.  Electronically Signed     AEK/MedQ  D:  09/12/2007 09:56:58  T:  09/12/2007 21:10:52  Job #:  [id number]

## 2010-12-20 NOTE — Procedures (Signed)
Robert Meadows, Robert Meadows              ACCOUNT NO.:  [id number]   MEDICAL RECORD NO.:  [id number]          PATIENT TYPE:  REC   LOCATION:  [facility]                          FACILITY:  [facility]   PHYSICIAN:  [name], M.D.DATE OF BIRTH:  [birth year]   DATE OF PROCEDURE:  DATE OF DISCHARGE:                               OPERATIVE REPORT   This is a botulinum toxin injection, right rectus abdominis; chronic  abdominal pain with myofascial pain, muscle spasm, 72885.  The patient  has had that prior relief with botulinum toxin injection to the upper  abdomen area and has only had partial relief with medications and  physical therapy.   Informed consent was obtained after describing risks and benefits of  procedure with the patient.  These include bleeding, bruising, and  infection.  He elects to proceed and has given written consent.  Injection done under needle EMG guidance.  Two areas in the right upper  rectus abdominis marked, prepped with Betadine, entered with a 26-gauge  50-mm needle electrode under needle EMG guidance.  Positive EMG activity  obtained.  Then, 1 mL of solution containing 50 units/mL of botulinum  toxin type A was injected into each site.  The patient tolerated the  procedure well.  Post injection instructions given.      [name], M.D.  Electronically Signed     AEK/MEDQ  D:  04/14/2008 13:46:50  T:  04/15/2008 03:01:57  Job:  [id number]

## 2010-12-20 NOTE — Procedures (Signed)
NAME:  COLLIS, THEDE NO.:  [id number]   MEDICAL RECORD NO.:  [id number]          PATIENT TYPE:  REC   LOCATION:  [facility]                          FACILITY:  [facility]   PHYSICIAN:  [name], M.D.DATE OF BIRTH:  06-07-1951   DATE OF PROCEDURE:  10/29/2008  DATE OF DISCHARGE:                               OPERATIVE REPORT   The patient with chronic muscle spasm of the rectus muscle, March 03, 1984.  Botox injection last one was helpful for about 2-1/2 months.  This started to wear off.  This is under EMG guidance.   His pain is only partially relieved by medication management and  stretching and interferes with his usual activities.   Right upper quadrant was then marked, prepped with Betadine, three areas  were identified as being sore.  These were entered with a 26-gauge 50-mm  needle under EMG guidance.  Appropriate EMG activity was obtained.  50  units was injected into one site at the most medial site followed by 25  units injected at 2 separate sites more laterally.  The patient  tolerated procedure well.  All injections done after negative drawback  for blood and once appropriate EMG activity was obtained.  The patient  tolerated procedure well.  Post-injection instructions were given.      [name], M.D.  Electronically Signed     AEK/MEDQ  D:  10/29/2008 15:00:30  T:  10/30/2008 02:30:30  Job:  [id number]

## 2010-12-20 NOTE — Assessment & Plan Note (Signed)
A 60 year old male with stabbing, aching abdominal pain since November,  2006.  Diagnosed with chronic pancreatitis, temporary results with  ultrasound-guided celiac plexus block.  He has had appropriate urine  drug screens, no evidence of illicit drug use.  No compliance issues  with his current treatment plan.  He has gone through physical therapy  for back pain and abdominal pain.  He has done well with the left knee  injection for DJD.   Main complaint is abdominal pain which is up now to the 8/10 level, it  was down to 3/10 last time.  He has had no new medical problems but is  planning to have some thyroid radiation next month.  Pain interferes  with activity at a moderate level.  Pain worse with activity as well as  inactivity.  He is able to climb steps, he drives.  He has no ADL  difficulties.  He has nausea, vomiting, constipation at times due to his  pancreatitis.   His blood pressure is 122/72, pulse 64, respiratory rate is 18, O2 sat  is 100% on room air.  GENERAL:  No acute distress.  Mood and affect appropriate.  ABDOMEN:  Positive bowel sounds.  He has tenderness at the right upper  insertion of the rectus abdominis muscle.  He has no rebound, no lower  quadrant tenderness.  He is able to do single leg raises but has  difficulty with double leg raise.  His gait is normal.   IMPRESSION:  Chronic abdominal pain, chronic pancreatitis.  I believe he  has a myofascial component as well.   PLAN:  1. Will continue Lidoderm patch over the abdomen area.  2. Abdominal trigger point injections, consider Botox.  3. Discontinue hydrocodone 5/500 b.i.d.  4. In terms of the back, he is doing fairly well with this and in      terms of his knee he does not require any further reinjection.      [name], M.D.  Electronically Signed     AEK/MedQ  D:  07/11/2007 08:59:17  T:  07/11/2007 09:43:56  Job #:  [id number]   cc:   Dr. Daryll Brod

## 2011-01-03 ENCOUNTER — Emergency Department (HOSPITAL_BASED_OUTPATIENT_CLINIC_OR_DEPARTMENT_OTHER)
Admission: EM | Admit: 2011-01-03 | Discharge: 2011-01-03 | Disposition: A | Discharge status: Home / Self Care | Payer: Medicare Other | Attending: Emergency Medicine | Admitting: Emergency Medicine

## 2011-01-03 DIAGNOSIS — R1013 Epigastric pain: Secondary | ICD-10-CM | POA: Insufficient documentation

## 2011-01-03 DIAGNOSIS — I252 Old myocardial infarction: Secondary | ICD-10-CM | POA: Insufficient documentation

## 2011-01-03 DIAGNOSIS — I10 Essential (primary) hypertension: Secondary | ICD-10-CM | POA: Insufficient documentation

## 2011-01-03 DIAGNOSIS — Z8679 Personal history of other diseases of the circulatory system: Secondary | ICD-10-CM | POA: Insufficient documentation

## 2011-01-03 DIAGNOSIS — Z79899 Other long term (current) drug therapy: Secondary | ICD-10-CM | POA: Insufficient documentation

## 2011-01-03 DIAGNOSIS — K861 Other chronic pancreatitis: Secondary | ICD-10-CM | POA: Insufficient documentation

## 2011-01-03 LAB — URINALYSIS, ROUTINE W REFLEX MICROSCOPIC
Leukocytes, UA: NEGATIVE
Nitrite: NEGATIVE
Specific Gravity, Urine: 1.026 (ref 1.005–1.030)
pH: 5.5 (ref 5.0–8.0)

## 2011-01-03 LAB — COMPREHENSIVE METABOLIC PANEL
Alkaline Phosphatase: 59 U/L (ref 39–117)
BUN: 13 mg/dL (ref 6–23)
Glucose, Bld: 104 mg/dL — ABNORMAL HIGH (ref 70–99)
Potassium: 3.2 mEq/L — ABNORMAL LOW (ref 3.5–5.1)
Total Protein: 7.3 g/dL (ref 6.0–8.3)

## 2011-01-03 LAB — CBC
MCV: 69.1 fL — ABNORMAL LOW (ref 78.0–100.0)
Platelets: 238 10*3/uL (ref 150–400)
RBC: 4.99 MIL/uL (ref 4.22–5.81)
WBC: 6 10*3/uL (ref 4.0–10.5)

## 2011-01-03 LAB — DIFFERENTIAL
Basophils Relative: 2 % — ABNORMAL HIGH (ref 0–1)
Eosinophils Absolute: 0.1 10*3/uL (ref 0.0–0.7)
Lymphs Abs: 2.6 10*3/uL (ref 0.7–4.0)
Neutrophils Relative %: 39 % — ABNORMAL LOW (ref 43–77)

## 2011-01-03 LAB — LIPASE, BLOOD: Lipase: 33 U/L (ref 11–59)

## 2011-01-03 LAB — URINE MICROSCOPIC-ADD ON

## 2011-04-14 ENCOUNTER — Encounter: Payer: Self-pay | Admitting: Emergency Medicine

## 2011-04-14 ENCOUNTER — Emergency Department (INDEPENDENT_AMBULATORY_CARE_PROVIDER_SITE_OTHER): Payer: Medicare Other

## 2011-04-14 ENCOUNTER — Emergency Department (HOSPITAL_BASED_OUTPATIENT_CLINIC_OR_DEPARTMENT_OTHER)
Admission: EM | Admit: 2011-04-14 | Discharge: 2011-04-15 | Disposition: A | Discharge status: Home / Self Care | Payer: Medicare Other | Attending: Emergency Medicine | Admitting: Emergency Medicine

## 2011-04-14 DIAGNOSIS — R112 Nausea with vomiting, unspecified: Secondary | ICD-10-CM | POA: Insufficient documentation

## 2011-04-14 DIAGNOSIS — R109 Unspecified abdominal pain: Secondary | ICD-10-CM

## 2011-04-14 DIAGNOSIS — R1084 Generalized abdominal pain: Secondary | ICD-10-CM

## 2011-04-14 DIAGNOSIS — K5289 Other specified noninfective gastroenteritis and colitis: Secondary | ICD-10-CM | POA: Insufficient documentation

## 2011-04-14 DIAGNOSIS — R197 Diarrhea, unspecified: Secondary | ICD-10-CM | POA: Insufficient documentation

## 2011-04-14 DIAGNOSIS — N400 Enlarged prostate without lower urinary tract symptoms: Secondary | ICD-10-CM | POA: Insufficient documentation

## 2011-04-14 DIAGNOSIS — K529 Noninfective gastroenteritis and colitis, unspecified: Secondary | ICD-10-CM

## 2011-04-14 DIAGNOSIS — I700 Atherosclerosis of aorta: Secondary | ICD-10-CM

## 2011-04-14 HISTORY — DX: Acute myocardial infarction, unspecified: I21.9

## 2011-04-14 HISTORY — DX: Malignant neoplasm of thyroid gland: C73

## 2011-04-14 HISTORY — DX: Cerebral infarction, unspecified: I63.9

## 2011-04-14 HISTORY — DX: Essential (primary) hypertension: I10

## 2011-04-14 HISTORY — DX: Acute pancreatitis without necrosis or infection, unspecified: K85.90

## 2011-04-14 LAB — CBC
HCT: 38.5 % — ABNORMAL LOW (ref 39.0–52.0)
MCHC: 34.3 g/dL (ref 30.0–36.0)
Platelets: 286 10*3/uL (ref 150–400)
RDW: 14.8 % (ref 11.5–15.5)
WBC: 6.6 10*3/uL (ref 4.0–10.5)

## 2011-04-14 LAB — DIFFERENTIAL
Basophils Absolute: 0.1 10*3/uL (ref 0.0–0.1)
Eosinophils Absolute: 0.1 10*3/uL (ref 0.0–0.7)
Lymphocytes Relative: 53 % — ABNORMAL HIGH (ref 12–46)
Monocytes Relative: 12 % (ref 3–12)
Neutro Abs: 2.1 10*3/uL (ref 1.7–7.7)
Neutrophils Relative %: 32 % — ABNORMAL LOW (ref 43–77)

## 2011-04-14 LAB — COMPREHENSIVE METABOLIC PANEL
AST: 35 U/L (ref 0–37)
Albumin: 4 g/dL (ref 3.5–5.2)
Alkaline Phosphatase: 73 U/L (ref 39–117)
BUN: 15 mg/dL (ref 6–23)
Chloride: 97 mEq/L (ref 96–112)
Potassium: 4.6 mEq/L (ref 3.5–5.1)
Total Protein: 8.2 g/dL (ref 6.0–8.3)

## 2011-04-14 LAB — LIPASE, BLOOD: Lipase: 46 U/L (ref 11–59)

## 2011-04-14 MED ORDER — HYDROMORPHONE HCL 1 MG/ML IJ SOLN
INTRAMUSCULAR | Status: AC
  Administered 2011-04-14: 1 mg via INTRAVENOUS
  Filled 2011-04-14: qty 1

## 2011-04-14 MED ORDER — SODIUM CHLORIDE 0.9 % IV BOLUS (SEPSIS)
1000.0000 mL | Freq: Once | INTRAVENOUS | Status: AC
  Administered 2011-04-14: 1000 mL via INTRAVENOUS

## 2011-04-14 MED ORDER — HYDROMORPHONE HCL 1 MG/ML IJ SOLN
1.0000 mg | Freq: Once | INTRAMUSCULAR | Status: AC
  Administered 2011-04-14: 1 mg via INTRAVENOUS

## 2011-04-14 MED ORDER — ONDANSETRON HCL 4 MG/2ML IJ SOLN
4.0000 mg | Freq: Once | INTRAMUSCULAR | Status: AC
  Administered 2011-04-14: 4 mg via INTRAVENOUS

## 2011-04-14 MED ORDER — ONDANSETRON HCL 4 MG/2ML IJ SOLN
INTRAMUSCULAR | Status: AC
  Administered 2011-04-14: 4 mg via INTRAVENOUS
  Filled 2011-04-14: qty 2

## 2011-04-14 NOTE — ED Notes (Signed)
Pt c/o abd pain with n/v/d since lst sat. Pt reports symptoms consistent with previous pancreatitis

## 2011-04-15 MED ORDER — OXYCODONE-ACETAMINOPHEN 5-325 MG PO TABS
1.0000 | ORAL_TABLET | Freq: Once | ORAL | Status: AC
  Administered 2011-04-15: 1 via ORAL

## 2011-04-15 MED ORDER — ONDANSETRON HCL 4 MG PO TABS: 4.0000 mg | ORAL_TABLET | Freq: Four times a day (QID) | ORAL | Status: AC

## 2011-04-15 MED ORDER — OXYCODONE-ACETAMINOPHEN 5-325 MG PO TABS: 2.0000 | ORAL_TABLET | ORAL | Status: AC | PRN

## 2011-04-15 MED ORDER — HYDROMORPHONE HCL 1 MG/ML IJ SOLN
INTRAMUSCULAR | Status: AC
  Administered 2011-04-15: 1 mg via INTRAVENOUS
  Filled 2011-04-15: qty 1

## 2011-04-15 MED ORDER — HYDROMORPHONE HCL 1 MG/ML IJ SOLN
1.0000 mg | Freq: Once | INTRAMUSCULAR | Status: AC
  Administered 2011-04-15: 1 mg via INTRAVENOUS

## 2011-04-15 MED ORDER — OXYCODONE-ACETAMINOPHEN 5-325 MG PO TABS
ORAL_TABLET | ORAL | Status: AC
  Administered 2011-04-15: 1 via ORAL
  Filled 2011-04-15: qty 1

## 2011-04-15 NOTE — ED Provider Notes (Signed)
History     CSN: [id number] Arrival date & time: 04/14/2011  9:12 PM  Chief Complaint  Patient presents with  . Abdominal Pain  . Nausea  . Emesis  . Diarrhea   Patient is a 60 y.o. male presenting with abdominal pain, vomiting, and diarrhea. The history is provided by the patient and the spouse. No language interpreter was used.  Abdominal Pain The primary symptoms of the illness include abdominal pain, vomiting and diarrhea. The primary symptoms of the illness do not include fever, fatigue, shortness of breath, hematemesis, hematochezia or dysuria. The current episode started more than 2 days ago (a week ago saturday). The onset of the illness was gradual. The problem has not changed since onset. The patient states that she believes she is currently not pregnant. The patient has not had a change in bowel habit. Risk factors for an acute abdominal problem include being elderly. Symptoms associated with the illness do not include chills, anorexia, diaphoresis, heartburn, constipation, urgency, hematuria, frequency or back pain. Significant associated medical issues do not include PUD, GERD, inflammatory bowel disease, diabetes, sickle cell disease, gallstones, liver disease, substance abuse, diverticulitis, HIV or cardiac disease.  Emesis  Associated symptoms include abdominal pain and diarrhea. Pertinent negatives include no chills and no fever.  Diarrhea The primary symptoms include abdominal pain, vomiting and diarrhea. Primary symptoms do not include fever, fatigue, hematemesis, hematochezia or dysuria.  The illness does not include chills, anorexia, constipation or back pain. Associated medical issues do not include inflammatory bowel disease, GERD, gallstones, liver disease, PUD or diverticulitis.    Past Medical History  Diagnosis Date  . Hypertension   . Pancreatitis   . Myocardial infarct   . Thyroid cancer   . Stroke     Past Surgical History  Procedure Date  . Thyroidectomy    . Pancreas surgery   . Knee surgery   . Lumbar epidural injection     No family history on file.  History  Substance Use Topics  . Smoking status: Current Everyday Smoker  . Smokeless tobacco: Not on file  . Alcohol Use: No      Review of Systems  Constitutional: Negative for fever, chills, diaphoresis and fatigue.  HENT: Negative for facial swelling.   Eyes: Negative for discharge.  Respiratory: Negative for shortness of breath.   Cardiovascular: Negative for chest pain.  Gastrointestinal: Positive for vomiting, abdominal pain and diarrhea. Negative for heartburn, constipation, hematochezia, abdominal distention, anorexia and hematemesis.  Genitourinary: Negative for dysuria, urgency, frequency and hematuria.  Musculoskeletal: Negative for back pain.  Neurological: Negative for dizziness.  Hematological: Negative.   Psychiatric/Behavioral: Negative.     Physical Exam  BP 124/63  Pulse 68  Temp(Src) 98.3 F (36.8 C) (Oral)  Resp 18  SpO2 100%  Physical Exam  Constitutional: He is oriented to person, place, and time. He appears well-developed and well-nourished. No distress.  HENT:  Head: Normocephalic and atraumatic.  Eyes: EOM are normal. Pupils are equal, round, and reactive to light.  Neck: Normal range of motion. Neck supple.  Cardiovascular: Normal rate and regular rhythm.   Pulmonary/Chest: Effort normal and breath sounds normal.  Abdominal: Soft. Bowel sounds are normal. He exhibits no distension and no mass. There is tenderness. There is no rebound and no guarding.  Musculoskeletal: Normal range of motion. He exhibits no edema.  Neurological: He is alert and oriented to person, place, and time.  Skin: Skin is warm and dry.  Psychiatric: He has a normal  mood and affect.    ED Course  Procedures  MDM Return for fevers, chills, inability to tolerate food or drink, inability to pass stool or gas or worsening pain.  Follow up with your family doctor on  Monday and have follow up outpatient PSA test for prostate gland.  Patient verbalizes understanding and agrees to follow up     Seng Larch Smitty Cords, MD 04/15/11 [phone]

## 2011-04-15 NOTE — ED Notes (Signed)
Pt verbalized understanding to return immediately to the ER if he develops an inability to pass stool and vomiting. Pt also verbalized understanding to f/u with urology in reference to his prostate being enlarged.

## 2011-05-01 LAB — CBC
Platelets: 226
RDW: 15.5
WBC: 5.3

## 2011-05-01 LAB — LIPASE, BLOOD: Lipase: 23

## 2011-05-01 LAB — URINALYSIS, ROUTINE W REFLEX MICROSCOPIC
Bilirubin Urine: NEGATIVE
Glucose, UA: NEGATIVE
Leukocytes, UA: NEGATIVE
Nitrite: NEGATIVE
Specific Gravity, Urine: 1.03
pH: 6

## 2011-05-01 LAB — URINE MICROSCOPIC-ADD ON

## 2011-05-01 LAB — DIFFERENTIAL
Basophils Relative: 1
Eosinophils Absolute: 0.1
Eosinophils Relative: 2
Monocytes Absolute: 0.5
Monocytes Relative: 9

## 2011-05-01 LAB — COMPREHENSIVE METABOLIC PANEL
ALT: 31
AST: 31
Albumin: 3.5
Alkaline Phosphatase: 48
Potassium: 4.2
Sodium: 141
Total Protein: 6.8

## 2011-05-08 LAB — GLUCOSE, CAPILLARY: Glucose-Capillary: 162 — ABNORMAL HIGH

## 2011-05-09 LAB — COMPREHENSIVE METABOLIC PANEL
ALT: 45
AST: 41 — ABNORMAL HIGH
AST: 52 — ABNORMAL HIGH
Albumin: 4.7
Albumin: 5
Alkaline Phosphatase: 68
Alkaline Phosphatase: 69
BUN: 21
Chloride: 92 — ABNORMAL LOW
Chloride: 94 — ABNORMAL LOW
GFR calc Af Amer: 51 — ABNORMAL LOW
GFR calc Af Amer: 51 — ABNORMAL LOW
Potassium: 3.1 — ABNORMAL LOW
Potassium: 3.6
Sodium: 142
Total Bilirubin: 0.6
Total Protein: 9 — ABNORMAL HIGH

## 2011-05-09 LAB — DIFFERENTIAL
Basophils Absolute: 0
Basophils Relative: 1
Basophils Relative: 1
Eosinophils Absolute: 0.1
Eosinophils Absolute: 0.2
Eosinophils Relative: 3
Eosinophils Relative: 3
Monocytes Absolute: 0.6
Monocytes Absolute: 0.9
Monocytes Relative: 16 — ABNORMAL HIGH
Neutro Abs: 1.9

## 2011-05-09 LAB — URINALYSIS, ROUTINE W REFLEX MICROSCOPIC
Bilirubin Urine: NEGATIVE
Glucose, UA: NEGATIVE
Protein, ur: NEGATIVE
Protein, ur: NEGATIVE
Urobilinogen, UA: 0.2

## 2011-05-09 LAB — URINE MICROSCOPIC-ADD ON

## 2011-05-09 LAB — CBC
HCT: 40.3
Platelets: 219
Platelets: 224
RBC: 5.2
RDW: 14.8
WBC: 4.4
WBC: 5.4

## 2011-05-23 LAB — COMPREHENSIVE METABOLIC PANEL
AST: 30
Alkaline Phosphatase: 63
CO2: 24
Chloride: 102
Creatinine, Ser: 1.36
GFR calc Af Amer: >60
GFR calc non Af Amer: 55 — ABNORMAL LOW
Total Bilirubin: 0.3

## 2011-05-23 LAB — CBC
HCT: 44
MCV: 75.3 — ABNORMAL LOW
RBC: 5.84 — ABNORMAL HIGH
WBC: 7.5

## 2011-05-23 LAB — DIFFERENTIAL
Basophils Absolute: 0
Basophils Relative: 0
Eosinophils Absolute: 0
Eosinophils Relative: 0
Lymphocytes Relative: 25

## 2011-05-23 LAB — LIPASE, BLOOD: Lipase: 19

## 2011-07-14 ENCOUNTER — Emergency Department (HOSPITAL_BASED_OUTPATIENT_CLINIC_OR_DEPARTMENT_OTHER)
Admission: EM | Admit: 2011-07-14 | Discharge: 2011-07-14 | Disposition: A | Discharge status: Home / Self Care | Payer: Medicare Other | Attending: Emergency Medicine | Admitting: Emergency Medicine

## 2011-07-14 ENCOUNTER — Emergency Department (INDEPENDENT_AMBULATORY_CARE_PROVIDER_SITE_OTHER): Payer: Medicare Other

## 2011-07-14 ENCOUNTER — Encounter (HOSPITAL_BASED_OUTPATIENT_CLINIC_OR_DEPARTMENT_OTHER): Payer: Self-pay

## 2011-07-14 DIAGNOSIS — R51 Headache: Secondary | ICD-10-CM | POA: Insufficient documentation

## 2011-07-14 DIAGNOSIS — J111 Influenza due to unidentified influenza virus with other respiratory manifestations: Secondary | ICD-10-CM | POA: Insufficient documentation

## 2011-07-14 DIAGNOSIS — R509 Fever, unspecified: Secondary | ICD-10-CM

## 2011-07-14 DIAGNOSIS — R0989 Other specified symptoms and signs involving the circulatory and respiratory systems: Secondary | ICD-10-CM

## 2011-07-14 DIAGNOSIS — Z79899 Other long term (current) drug therapy: Secondary | ICD-10-CM | POA: Insufficient documentation

## 2011-07-14 DIAGNOSIS — I10 Essential (primary) hypertension: Secondary | ICD-10-CM | POA: Insufficient documentation

## 2011-07-14 DIAGNOSIS — R05 Cough: Secondary | ICD-10-CM

## 2011-07-14 MED ORDER — ACETAMINOPHEN 325 MG PO TABS
650.0000 mg | ORAL_TABLET | Freq: Once | ORAL | Status: AC
  Administered 2011-07-14: 650 mg via ORAL
  Filled 2011-07-14: qty 2

## 2011-07-14 MED ORDER — HYDROCODONE-ACETAMINOPHEN 5-500 MG PO TABS: 1.0000 | ORAL_TABLET | Freq: Four times a day (QID) | ORAL | Status: AC | PRN

## 2011-07-14 NOTE — ED Notes (Signed)
Chills fever cough since yesterday. Has been taking Tylenol and otc flu and cold medication.

## 2011-07-14 NOTE — ED Provider Notes (Signed)
History     CSN: [id number] Arrival date & time: 07/14/2011  6:22 PM   First MD Initiated Contact with Patient 07/14/11 1831      Chief Complaint  Patient presents with  . Cough  . Fever  . Headache    (Consider location/radiation/quality/duration/timing/severity/associated sxs/prior treatment) Patient is a 60 y.o. male presenting with cough, fever, and headaches. The history is provided by the patient. No language interpreter was used.  Cough This is a new problem. The current episode started yesterday. The problem occurs constantly. The problem has not changed since onset.The cough is non-productive. The maximum temperature recorded prior to his arrival was 101 to 101.9 F. Associated symptoms include headaches, sore throat and myalgias. Pertinent negatives include no shortness of breath. He has tried decongestants for the symptoms. He is a smoker.  Fever Primary symptoms of the febrile illness include fever, headaches, cough and myalgias. Primary symptoms do not include shortness of breath.  Headache  Associated symptoms include a fever. Pertinent negatives include no shortness of breath.    Past Medical History  Diagnosis Date  . Hypertension   . Pancreatitis   . Myocardial infarct   . Thyroid cancer   . Stroke     Past Surgical History  Procedure Date  . Thyroidectomy   . Pancreas surgery   . Knee surgery   . Lumbar epidural injection     No family history on file.  History  Substance Use Topics  . Smoking status: Current Everyday Smoker  . Smokeless tobacco: Not on file  . Alcohol Use: No      Review of Systems  Constitutional: Positive for fever.  HENT: Positive for sore throat.   Respiratory: Positive for cough. Negative for shortness of breath.   Musculoskeletal: Positive for myalgias.  Neurological: Positive for headaches.  All other systems reviewed and are negative.    Allergies  Ivp dye and Spinach  Home Medications   Current Outpatient  Rx  Name Route Sig Dispense Refill  . ALBUTEROL 90 MCG/ACT IN AERS Inhalation Inhale 2 puffs into the lungs every 6 (six) hours as needed. Shortness of breath and wheezing     . AMLODIPINE BESYLATE 10 MG PO TABS Oral Take 10 mg by mouth daily.      . ASPIRIN 325 MG PO TBEC Oral Take 325 mg by mouth daily.      . ATENOLOL 100 MG PO TABS Oral Take 100 mg by mouth daily.      . ATORVASTATIN CALCIUM 20 MG PO TABS Oral Take 20 mg by mouth at bedtime.      Marland Kitchen CLONIDINE HCL 0.2 MG PO TABS Oral Take 0.2 mg by mouth at bedtime.      . CLOPIDOGREL BISULFATE 75 MG PO TABS Oral Take 75 mg by mouth daily.      Marland Kitchen ESOMEPRAZOLE MAGNESIUM 40 MG PO CPDR Oral Take 40 mg by mouth daily before breakfast.      . FUROSEMIDE 40 MG PO TABS Oral Take 40 mg by mouth daily.      Marland Kitchen GABAPENTIN 600 MG PO TABS Oral Take 600 mg by mouth 3 (three) times daily as needed. Nerve pain     . HYDROCODONE-ACETAMINOPHEN 5-325 MG PO TABS Oral Take 1 tablet by mouth every 6 (six) hours as needed. pain     . HYDROCODONE-ACETAMINOPHEN 5-500 MG PO TABS Oral Take 1-2 tablets by mouth every 6 (six) hours as needed for pain. 15 tablet 0  . HYDROCORTISONE 2.5 %  EX CREA Topical Apply topically 2 (two) times daily as needed. Hemorids     . IMIPRAMINE HCL 25 MG PO TABS Oral Take 25 mg by mouth at bedtime.      . ISOSORBIDE MONONITRATE ER 30 MG PO TB24 Oral Take 30 mg by mouth daily.      Marland Kitchen LEVOTHYROXINE SODIUM 300 MCG PO TABS Oral Take 300 mcg by mouth daily.      Marland Kitchen LIDOCAINE 5 % EX PTCH Transdermal Place 1 patch onto the skin daily. Remove & Discard patch within 12 hours or as directed by MD     . Claris Gladden POTASSIUM-HCTZ 100-25 MG PO TABS Oral Take 1 tablet by mouth daily.     Marland Kitchen NITROGLYCERIN 0.4 MG SL SUBL Sublingual Place 0.4 mg under the tongue every 5 (five) minutes as needed. Chest pain     . PANCRELIPASE (LIP-PROT-AMYL) 5000 UNITS PO CPEP Oral Take 3 capsules by mouth at bedtime.      Marland Kitchen POTASSIUM CHLORIDE 10 MEQ PO TBCR Oral Take 10 mEq by  mouth daily.      Marland Kitchen PROMETHAZINE HCL 25 MG PO TABS Oral Take 25 mg by mouth every 6 (six) hours as needed. nausea       BP 103/64  Pulse 74  Temp(Src) 100.9 F (38.3 C) (Oral)  Resp 20  Ht 6\' 1"  (1.854 m)  Wt 225 lb (102.059 kg)  BMI 29.69 kg/m2  SpO2 97%  Physical Exam  Nursing note and vitals reviewed. Constitutional: He is oriented to person, place, and time. He appears well-developed and well-nourished.  HENT:  Right Ear: External ear normal.  Left Ear: External ear normal.  Mouth/Throat: Posterior oropharyngeal erythema present.  Cardiovascular: Regular rhythm.   Pulmonary/Chest: Effort normal and breath sounds normal.  Abdominal: Soft. Bowel sounds are normal.  Musculoskeletal: Normal range of motion.  Neurological: He is alert and oriented to person, place, and time.  Skin: Skin is dry.  Psychiatric: He has a normal mood and affect.    ED Course  Procedures (including critical care time)  Labs Reviewed - No data to display Dg Chest 2 View  07/14/2011  *RADIOLOGY REPORT*  Clinical Data: Cough.  Fever.  Chest congestion.  CHEST - 2 VIEW 07/14/2011:  Comparison: Two-view chest x-ray 06/30/2009 [facility]. Portable chest x-ray 10/14/2008 [facility] and 02/17/2007 [facility].  Findings: Cardiomediastinal silhouette unremarkable and unchanged. Lungs clear.  Bronchovascular markings normal.  No pleural effusions.  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  No significant interval change.  IMPRESSION: No acute cardiopulmonary disease.  Stable examination.  Original Report Authenticated By: Arnell Sieving, M.D.     1. Influenza       MDM  Pt is non septic appearing and tolerating po here:likely influenza:pt is refusing tamiflu as he states that he can't afford it        Teressa Lower, NP 07/14/11 2023

## 2011-07-14 NOTE — ED Notes (Signed)
Pt reports, fever, generalized body aches, cough and headache that started yesterday.

## 2011-07-17 NOTE — ED Provider Notes (Signed)
Medical screening examination/treatment/procedure(s) were performed by non-physician practitioner and as supervising physician I was immediately available for consultation/collaboration.  Toy Baker, MD 07/17/11 [phone]

## 2012-02-29 ENCOUNTER — Emergency Department (HOSPITAL_BASED_OUTPATIENT_CLINIC_OR_DEPARTMENT_OTHER)
Admission: EM | Admit: 2012-02-29 | Discharge: 2012-02-29 | Disposition: A | Discharge status: Home / Self Care | Payer: Medicare Other | Attending: Emergency Medicine | Admitting: Emergency Medicine

## 2012-02-29 ENCOUNTER — Encounter (HOSPITAL_BASED_OUTPATIENT_CLINIC_OR_DEPARTMENT_OTHER): Payer: Self-pay | Admitting: Emergency Medicine

## 2012-02-29 DIAGNOSIS — Z85850 Personal history of malignant neoplasm of thyroid: Secondary | ICD-10-CM | POA: Insufficient documentation

## 2012-02-29 DIAGNOSIS — F17200 Nicotine dependence, unspecified, uncomplicated: Secondary | ICD-10-CM | POA: Insufficient documentation

## 2012-02-29 DIAGNOSIS — I252 Old myocardial infarction: Secondary | ICD-10-CM | POA: Insufficient documentation

## 2012-02-29 DIAGNOSIS — I10 Essential (primary) hypertension: Secondary | ICD-10-CM | POA: Insufficient documentation

## 2012-02-29 DIAGNOSIS — R109 Unspecified abdominal pain: Secondary | ICD-10-CM | POA: Insufficient documentation

## 2012-02-29 DIAGNOSIS — Z8673 Personal history of transient ischemic attack (TIA), and cerebral infarction without residual deficits: Secondary | ICD-10-CM | POA: Insufficient documentation

## 2012-02-29 LAB — URINALYSIS, ROUTINE W REFLEX MICROSCOPIC
Bilirubin Urine: NEGATIVE
Glucose, UA: NEGATIVE mg/dL
Specific Gravity, Urine: 1.005 (ref 1.005–1.030)

## 2012-02-29 LAB — URINE MICROSCOPIC-ADD ON

## 2012-02-29 LAB — COMPREHENSIVE METABOLIC PANEL
ALT: 17 U/L (ref 0–53)
Albumin: 3.9 g/dL (ref 3.5–5.2)
Alkaline Phosphatase: 62 U/L (ref 39–117)
Calcium: 9.8 mg/dL (ref 8.4–10.5)
Potassium: 3.2 mEq/L — ABNORMAL LOW (ref 3.5–5.1)
Sodium: 138 mEq/L (ref 135–145)
Total Protein: 8.1 g/dL (ref 6.0–8.3)

## 2012-02-29 LAB — CBC WITH DIFFERENTIAL/PLATELET
Basophils Absolute: 0.1 10*3/uL (ref 0.0–0.1)
Eosinophils Absolute: 0.1 10*3/uL (ref 0.0–0.7)
Hemoglobin: 12.5 g/dL — ABNORMAL LOW (ref 13.0–17.0)
Lymphocytes Relative: 37 % (ref 12–46)
MCHC: 33.9 g/dL (ref 30.0–36.0)
Monocytes Absolute: 0.5 10*3/uL (ref 0.1–1.0)
Neutrophils Relative %: 50 % (ref 43–77)
Platelets: 270 10*3/uL (ref 150–400)
RDW: 14.9 % (ref 11.5–15.5)

## 2012-02-29 LAB — LIPASE, BLOOD: Lipase: 32 U/L (ref 11–59)

## 2012-02-29 MED ORDER — HYDROMORPHONE HCL PF 1 MG/ML IJ SOLN
1.0000 mg | Freq: Once | INTRAMUSCULAR | Status: AC
  Administered 2012-02-29: 1 mg via INTRAVENOUS
  Filled 2012-02-29: qty 1

## 2012-02-29 MED ORDER — SODIUM CHLORIDE 0.9 % IV BOLUS (SEPSIS)
500.0000 mL | Freq: Once | INTRAVENOUS | Status: AC
  Administered 2012-02-29: 500 mL via INTRAVENOUS

## 2012-02-29 MED ORDER — ONDANSETRON HCL 4 MG/2ML IJ SOLN
4.0000 mg | Freq: Once | INTRAMUSCULAR | Status: AC
  Administered 2012-02-29: 4 mg via INTRAVENOUS
  Filled 2012-02-29: qty 2

## 2012-02-29 NOTE — ED Provider Notes (Signed)
History     CSN: [id number]  Arrival date & time 02/29/12  1230   First MD Initiated Contact with Patient 02/29/12 1246      Chief Complaint  Patient presents with  . Abdominal Pain  . Nausea  . Emesis    (Consider location/radiation/quality/duration/timing/severity/associated sxs/prior treatment) HPI Comments: Pt states that he has a history of pancreatitis unsure of the cause:pt states that he has not had to be hospitalized in a long time because he get 3 shot of dilaudid and then can usually go home:pt states that he is under chronic pain management  Patient is a 61 y.o. male presenting with abdominal pain. The history is provided by the patient. No language interpreter was used.  Abdominal Pain The primary symptoms of the illness include nausea. The current episode started yesterday. The onset of the illness was gradual. The problem has not changed since onset.   Past Medical History  Diagnosis Date  . Hypertension   . Pancreatitis   . Myocardial infarct   . Thyroid cancer   . Stroke     Past Surgical History  Procedure Date  . Thyroidectomy   . Pancreas surgery   . Knee surgery   . Lumbar epidural injection     History reviewed. No pertinent family history.  History  Substance Use Topics  . Smoking status: Current Everyday Smoker  . Smokeless tobacco: Not on file  . Alcohol Use: No      Review of Systems  Constitutional: Negative.   Respiratory: Negative.   Cardiovascular: Negative.   Gastrointestinal: Positive for nausea.  Neurological: Negative.     Allergies  Ivp dye and Spinach  Home Medications   Current Outpatient Rx  Name Route Sig Dispense Refill  . FENTANYL 75 MCG/HR TD PT72 Transdermal Place 1 patch onto the skin every 3 (three) days.    . ALBUTEROL 90 MCG/ACT IN AERS Inhalation Inhale 2 puffs into the lungs every 6 (six) hours as needed. Shortness of breath and wheezing     . AMLODIPINE BESYLATE 10 MG PO TABS Oral Take 10 mg by  mouth daily.      . ASPIRIN 325 MG PO TBEC Oral Take 325 mg by mouth daily.      . ATENOLOL 100 MG PO TABS Oral Take 100 mg by mouth daily.      . ATORVASTATIN CALCIUM 20 MG PO TABS Oral Take 20 mg by mouth at bedtime.      Marland Kitchen CLONIDINE HCL 0.2 MG PO TABS Oral Take 0.2 mg by mouth at bedtime.      . CLOPIDOGREL BISULFATE 75 MG PO TABS Oral Take 75 mg by mouth daily.      Marland Kitchen ESOMEPRAZOLE MAGNESIUM 40 MG PO CPDR Oral Take 40 mg by mouth daily before breakfast.      . FUROSEMIDE 40 MG PO TABS Oral Take 40 mg by mouth daily.      Marland Kitchen GABAPENTIN 600 MG PO TABS Oral Take 600 mg by mouth 3 (three) times daily as needed. Nerve pain     . HYDROCODONE-ACETAMINOPHEN 5-325 MG PO TABS Oral Take 1 tablet by mouth every 6 (six) hours as needed. pain     . HYDROCORTISONE 2.5 % EX CREA Topical Apply topically 2 (two) times daily as needed. Hemorids     . IMIPRAMINE HCL 25 MG PO TABS Oral Take 25 mg by mouth at bedtime.      . ISOSORBIDE MONONITRATE ER 30 MG PO TB24 Oral Take  30 mg by mouth daily.      Marland Kitchen LEVOTHYROXINE SODIUM 300 MCG PO TABS Oral Take 300 mcg by mouth daily.      Marland Kitchen LIDOCAINE 5 % EX PTCH Transdermal Place 1 patch onto the skin daily. Remove & Discard patch within 12 hours or as directed by MD    . Claris Gladden POTASSIUM-HCTZ 100-25 MG PO TABS Oral Take 1 tablet by mouth daily.     Marland Kitchen NITROGLYCERIN 0.4 MG SL SUBL Sublingual Place 0.4 mg under the tongue every 5 (five) minutes as needed. Chest pain     . PANCRELIPASE (LIP-PROT-AMYL) 5000 UNITS PO CPEP Oral Take 3 capsules by mouth at bedtime.      Marland Kitchen POTASSIUM CHLORIDE 10 MEQ PO TBCR Oral Take 10 mEq by mouth daily.      Marland Kitchen PROMETHAZINE HCL 25 MG PO TABS Oral Take 25 mg by mouth every 6 (six) hours as needed. nausea       BP 113/66  Pulse 60  Temp 98.5 F (36.9 C) (Oral)  Resp 18  Ht 6\' 1"  (1.854 m)  Wt 213 lb (96.616 kg)  BMI 28.10 kg/m2  SpO2 100%  Physical Exam  Nursing note and vitals reviewed. Constitutional: He appears well-developed and  well-nourished.  Cardiovascular: Normal rate and regular rhythm.   Pulmonary/Chest: Effort normal and breath sounds normal.  Abdominal: Soft. Bowel sounds are normal. There is tenderness in the epigastric area and left upper quadrant.  Musculoskeletal: Normal range of motion.  Neurological: He is alert.  Skin: Skin is warm and dry.    ED Course  Procedures (including critical care time)  Labs Reviewed  CBC WITH DIFFERENTIAL - Abnormal; Notable for the following:    Hemoglobin 12.5 (*)     HCT 36.9 (*)     MCV 68.3 (*)     MCH 23.1 (*)     All other components within normal limits  COMPREHENSIVE METABOLIC PANEL - Abnormal; Notable for the following:    Potassium 3.2 (*)     Chloride 95 (*)     CO2 34 (*)     GFR calc non Af Amer 72 (*)     GFR calc Af Amer 83 (*)     All other components within normal limits  URINALYSIS, ROUTINE W REFLEX MICROSCOPIC - Abnormal; Notable for the following:    Hgb urine dipstick SMALL (*)     All other components within normal limits  LIPASE, BLOOD  URINE MICROSCOPIC-ADD ON   No results found.   1. Abdominal pain       MDM  Pt is feeling better at this time:don't think pt needs ct scan at this time        Teressa Lower, NP 02/29/12 1541

## 2012-02-29 NOTE — ED Notes (Addendum)
Pt in room 6. Pt brought to room via wheelchair. Pt states pain started yesterday. Pt also states he has a history of pancreatitis, with no flare ups within a month. Pt rates pain a 10 out of 10. Pt states that he took," gabapentin and a nausea pill this morning" with no relief. Pt also come in with a fentanyl patch 75 mg on midline of abdomen.

## 2012-03-01 NOTE — ED Provider Notes (Signed)
Medical screening examination/treatment/procedure(s) were performed by non-physician practitioner and as supervising physician I was immediately available for consultation/collaboration.  Marye Eagen, MD 03/01/12 0742 

## 2012-04-22 ENCOUNTER — Encounter (HOSPITAL_BASED_OUTPATIENT_CLINIC_OR_DEPARTMENT_OTHER): Payer: Self-pay | Admitting: *Deleted

## 2012-04-22 ENCOUNTER — Emergency Department (HOSPITAL_BASED_OUTPATIENT_CLINIC_OR_DEPARTMENT_OTHER)
Admission: EM | Admit: 2012-04-22 | Discharge: 2012-04-22 | Disposition: A | Discharge status: Home / Self Care | Payer: Medicare Other | Attending: Emergency Medicine | Admitting: Emergency Medicine

## 2012-04-22 DIAGNOSIS — Z91018 Allergy to other foods: Secondary | ICD-10-CM | POA: Insufficient documentation

## 2012-04-22 DIAGNOSIS — R1110 Vomiting, unspecified: Secondary | ICD-10-CM | POA: Insufficient documentation

## 2012-04-22 DIAGNOSIS — Z91041 Radiographic dye allergy status: Secondary | ICD-10-CM | POA: Insufficient documentation

## 2012-04-22 DIAGNOSIS — Z8673 Personal history of transient ischemic attack (TIA), and cerebral infarction without residual deficits: Secondary | ICD-10-CM | POA: Insufficient documentation

## 2012-04-22 DIAGNOSIS — R109 Unspecified abdominal pain: Secondary | ICD-10-CM | POA: Insufficient documentation

## 2012-04-22 DIAGNOSIS — Z85850 Personal history of malignant neoplasm of thyroid: Secondary | ICD-10-CM | POA: Insufficient documentation

## 2012-04-22 DIAGNOSIS — I252 Old myocardial infarction: Secondary | ICD-10-CM | POA: Insufficient documentation

## 2012-04-22 DIAGNOSIS — K861 Other chronic pancreatitis: Secondary | ICD-10-CM | POA: Insufficient documentation

## 2012-04-22 DIAGNOSIS — F17200 Nicotine dependence, unspecified, uncomplicated: Secondary | ICD-10-CM | POA: Insufficient documentation

## 2012-04-22 DIAGNOSIS — Z7982 Long term (current) use of aspirin: Secondary | ICD-10-CM | POA: Insufficient documentation

## 2012-04-22 DIAGNOSIS — I10 Essential (primary) hypertension: Secondary | ICD-10-CM | POA: Insufficient documentation

## 2012-04-22 LAB — CBC WITH DIFFERENTIAL/PLATELET
Basophils Relative: 0 % (ref 0–1)
Eosinophils Absolute: 0 10*3/uL (ref 0.0–0.7)
HCT: 34.8 % — ABNORMAL LOW (ref 39.0–52.0)
Hemoglobin: 12 g/dL — ABNORMAL LOW (ref 13.0–17.0)
Lymphocytes Relative: 31 % (ref 12–46)
Lymphs Abs: 1.6 10*3/uL (ref 0.7–4.0)
MCH: 23.6 pg — ABNORMAL LOW (ref 26.0–34.0)
MCHC: 34.5 g/dL (ref 30.0–36.0)
MCV: 68.4 fL — ABNORMAL LOW (ref 78.0–100.0)
Neutro Abs: 3 10*3/uL (ref 1.7–7.7)

## 2012-04-22 LAB — COMPREHENSIVE METABOLIC PANEL
ALT: 15 U/L (ref 0–53)
AST: 17 U/L (ref 0–37)
Albumin: 4.3 g/dL (ref 3.5–5.2)
Alkaline Phosphatase: 58 U/L (ref 39–117)
Chloride: 96 mEq/L (ref 96–112)
Potassium: 3 mEq/L — ABNORMAL LOW (ref 3.5–5.1)
Sodium: 138 mEq/L (ref 135–145)
Total Protein: 8.1 g/dL (ref 6.0–8.3)

## 2012-04-22 LAB — LIPASE, BLOOD: Lipase: 29 U/L (ref 11–59)

## 2012-04-22 MED ORDER — HYDROMORPHONE HCL PF 1 MG/ML IJ SOLN
1.0000 mg | Freq: Once | INTRAMUSCULAR | Status: AC
  Administered 2012-04-22: 1 mg via INTRAVENOUS
  Filled 2012-04-22: qty 1

## 2012-04-22 MED ORDER — ONDANSETRON HCL 4 MG/2ML IJ SOLN
4.0000 mg | Freq: Once | INTRAMUSCULAR | Status: AC
  Administered 2012-04-22: 4 mg via INTRAVENOUS
  Filled 2012-04-22: qty 2

## 2012-04-22 MED ORDER — HYDROMORPHONE HCL PF 2 MG/ML IJ SOLN
2.0000 mg | Freq: Once | INTRAMUSCULAR | Status: AC
  Administered 2012-04-22: 2 mg via INTRAVENOUS
  Filled 2012-04-22: qty 1

## 2012-04-22 MED ORDER — SODIUM CHLORIDE 0.9 % IV BOLUS (SEPSIS)
1000.0000 mL | Freq: Once | INTRAVENOUS | Status: AC
  Administered 2012-04-22: 1000 mL via INTRAVENOUS

## 2012-04-22 NOTE — ED Notes (Signed)
Patient states he has had upper abdominal pain with nausea and vomiting for the last 2 days.  Hx of chronic pancreatitis.

## 2012-04-22 NOTE — ED Notes (Signed)
Family at bedside. 

## 2012-04-22 NOTE — ED Notes (Signed)
Pt. Reports feeling better at this time.  

## 2012-04-22 NOTE — ED Provider Notes (Signed)
History     CSN: [id number]  Arrival date & time 04/22/12  1302   First MD Initiated Contact with Patient 04/22/12 1320      Chief Complaint  Patient presents with  . Abdominal Pain    (Consider location/radiation/quality/duration/timing/severity/associated sxs/prior treatment) HPI Comments: Patient with history of chronic pancreatitis.  He presents with a 2-3 day history of worsening epigastric pain and vomiting, and is having difficulty taking po.  He denies fevers or chills.  The wife reports that he has been getting weaker over the past several days as well.  Patient is a 61 y.o. male presenting with abdominal pain. The history is provided by the patient.  Abdominal Pain The primary symptoms of the illness include abdominal pain, fatigue, nausea and vomiting. The primary symptoms of the illness do not include fever, diarrhea or dysuria. Episode onset: 2-3 days ago. The onset of the illness was gradual. The problem has been rapidly worsening.  Associated with: chronic pancreatitis. Symptoms associated with the illness do not include chills.    Past Medical History  Diagnosis Date  . Hypertension   . Pancreatitis   . Myocardial infarct   . Thyroid cancer   . Stroke     Past Surgical History  Procedure Date  . Thyroidectomy   . Pancreas surgery   . Knee surgery   . Lumbar epidural injection     No family history on file.  History  Substance Use Topics  . Smoking status: Current Every Day Smoker  . Smokeless tobacco: Not on file  . Alcohol Use: No      Review of Systems  Constitutional: Positive for fatigue. Negative for fever and chills.  Gastrointestinal: Positive for nausea, vomiting and abdominal pain. Negative for diarrhea.  Genitourinary: Negative for dysuria.  All other systems reviewed and are negative.    Allergies  Ivp dye and Spinach  Home Medications   Current Outpatient Rx  Name Route Sig Dispense Refill  . ALBUTEROL 90 MCG/ACT IN AERS  Inhalation Inhale 2 puffs into the lungs every 6 (six) hours as needed. Shortness of breath and wheezing     . AMLODIPINE BESYLATE 10 MG PO TABS Oral Take 10 mg by mouth every morning.     . ASPIRIN 325 MG PO TBEC Oral Take 325 mg by mouth every morning.     . ATENOLOL 100 MG PO TABS Oral Take 100 mg by mouth every morning.     . ATORVASTATIN CALCIUM 20 MG PO TABS Oral Take 20 mg by mouth at bedtime.      Marland Kitchen CLONIDINE HCL 0.2 MG PO TABS Oral Take 0.2 mg by mouth at bedtime.      . CLOPIDOGREL BISULFATE 75 MG PO TABS Oral Take 75 mg by mouth every morning.     Marland Kitchen ESOMEPRAZOLE MAGNESIUM 40 MG PO CPDR Oral Take 40 mg by mouth daily before breakfast.      . FENTANYL 75 MCG/HR TD PT72 Transdermal Place 1 patch onto the skin every 3 (three) days.    . FUROSEMIDE 40 MG PO TABS Oral Take 40 mg by mouth every morning.     Marland Kitchen GABAPENTIN 600 MG PO TABS Oral Take 900 mg by mouth 3 (three) times daily as needed. Nerve pain    . HYDROCODONE-ACETAMINOPHEN 5-325 MG PO TABS Oral Take 1 tablet by mouth every 6 (six) hours as needed. pain     . HYDROCORTISONE 2.5 % EX CREA Topical Apply topically 2 (two) times daily as  needed. Hemorids     . IMIPRAMINE HCL 25 MG PO TABS Oral Take 25 mg by mouth at bedtime.      . ISOSORBIDE MONONITRATE ER 30 MG PO TB24 Oral Take 15 mg by mouth every morning.     Marland Kitchen LEVOTHYROXINE SODIUM 300 MCG PO TABS Oral Take 300 mcg by mouth daily.      Marland Kitchen LIDOCAINE 5 % EX PTCH Transdermal Place 1 patch onto the skin daily. Remove & Discard patch within 12 hours or as directed by MD    . Claris Gladden POTASSIUM-HCTZ 100-25 MG PO TABS Oral Take 1 tablet by mouth every morning.     Marland Kitchen METFORMIN HCL 500 MG PO TABS Oral Take 500 mg by mouth 2 (two) times daily with a meal.    . NITROGLYCERIN 0.4 MG SL SUBL Sublingual Place 0.4 mg under the tongue every 5 (five) minutes x 3 doses as needed. Chest pain    . PANCRELIPASE (LIP-PROT-AMYL) 5000 UNITS PO CPEP Oral Take 3 capsules by mouth at bedtime.      Marland Kitchen  POTASSIUM CHLORIDE 10 MEQ PO TBCR Oral Take 10 mEq by mouth every morning.     Marland Kitchen PROMETHAZINE HCL 25 MG PO TABS Oral Take 25 mg by mouth every 6 (six) hours as needed. nausea      BP 104/87  Pulse 66  Temp 97.6 F (36.4 C) (Oral)  Resp 20  Ht 6\' 1"  (1.854 m)  Wt 190 lb (86.183 kg)  BMI 25.07 kg/m2  SpO2 100%  Physical Exam  Nursing note and vitals reviewed. Constitutional: He is oriented to person, place, and time. He appears well-developed and well-nourished.       The patient is actively retching but there is little emesis.  HENT:  Head: Normocephalic and atraumatic.  Neck: Normal range of motion. Neck supple.  Cardiovascular: Normal rate and regular rhythm.   No murmur heard. Pulmonary/Chest: Effort normal and breath sounds normal.  Abdominal: Soft. Bowel sounds are normal.  Neurological: He is alert and oriented to person, place, and time.  Skin: Skin is warm and dry.    ED Course  Procedures (including critical care time)   Labs Reviewed  CBC WITH DIFFERENTIAL  COMPREHENSIVE METABOLIC PANEL  LIPASE, BLOOD   No results found.   No diagnosis found.    MDM  The patient presents with a recurrent bout of abdominal pain, vomiting, and decreased po intake.  He has a history of chronic pancreatitis and this happens from time to time.  He was given pain meds and iv fluids here and is feeling much better.  The wife is insistent that he needs to be in the hospital, however he does not want this.  He wants to go home.  The labs show no elevation of wbc, electrolyte abnormalities, or increase in the lipase.  He will be discharged at his request, to return prn if he worsens.          Geoffery Lyons, MD 04/22/12 [phone]

## 2012-04-22 NOTE — ED Notes (Signed)
No reports of diarrhea and Pt has had vomiting x 2 days per wife of pt.

## 2012-06-15 ENCOUNTER — Emergency Department (HOSPITAL_BASED_OUTPATIENT_CLINIC_OR_DEPARTMENT_OTHER)
Admission: EM | Admit: 2012-06-15 | Discharge: 2012-06-15 | Disposition: A | Discharge status: Home / Self Care | Payer: Medicare Other | Attending: Emergency Medicine | Admitting: Emergency Medicine

## 2012-06-15 ENCOUNTER — Encounter (HOSPITAL_BASED_OUTPATIENT_CLINIC_OR_DEPARTMENT_OTHER): Payer: Self-pay | Admitting: *Deleted

## 2012-06-15 DIAGNOSIS — Z8719 Personal history of other diseases of the digestive system: Secondary | ICD-10-CM | POA: Insufficient documentation

## 2012-06-15 DIAGNOSIS — E119 Type 2 diabetes mellitus without complications: Secondary | ICD-10-CM | POA: Insufficient documentation

## 2012-06-15 DIAGNOSIS — Y92009 Unspecified place in unspecified non-institutional (private) residence as the place of occurrence of the external cause: Secondary | ICD-10-CM | POA: Insufficient documentation

## 2012-06-15 DIAGNOSIS — X58XXXA Exposure to other specified factors, initial encounter: Secondary | ICD-10-CM | POA: Insufficient documentation

## 2012-06-15 DIAGNOSIS — IMO0002 Reserved for concepts with insufficient information to code with codable children: Secondary | ICD-10-CM | POA: Insufficient documentation

## 2012-06-15 DIAGNOSIS — I252 Old myocardial infarction: Secondary | ICD-10-CM | POA: Insufficient documentation

## 2012-06-15 DIAGNOSIS — I10 Essential (primary) hypertension: Secondary | ICD-10-CM | POA: Insufficient documentation

## 2012-06-15 DIAGNOSIS — R1110 Vomiting, unspecified: Secondary | ICD-10-CM | POA: Insufficient documentation

## 2012-06-15 DIAGNOSIS — Z85850 Personal history of malignant neoplasm of thyroid: Secondary | ICD-10-CM | POA: Insufficient documentation

## 2012-06-15 DIAGNOSIS — M549 Dorsalgia, unspecified: Secondary | ICD-10-CM

## 2012-06-15 DIAGNOSIS — Z87891 Personal history of nicotine dependence: Secondary | ICD-10-CM | POA: Insufficient documentation

## 2012-06-15 DIAGNOSIS — Z8673 Personal history of transient ischemic attack (TIA), and cerebral infarction without residual deficits: Secondary | ICD-10-CM | POA: Insufficient documentation

## 2012-06-15 DIAGNOSIS — Y939 Activity, unspecified: Secondary | ICD-10-CM | POA: Insufficient documentation

## 2012-06-15 HISTORY — DX: Type 2 diabetes mellitus without complications: E11.9

## 2012-06-15 MED ORDER — SODIUM CHLORIDE 0.9 % IV SOLN
INTRAVENOUS | Status: DC
  Administered 2012-06-15: 22:00:00 via INTRAVENOUS

## 2012-06-15 MED ORDER — HYDROMORPHONE HCL PF 1 MG/ML IJ SOLN
1.0000 mg | Freq: Once | INTRAMUSCULAR | Status: AC
  Administered 2012-06-15: 1 mg via INTRAVENOUS
  Filled 2012-06-15: qty 1

## 2012-06-15 MED ORDER — KETOROLAC TROMETHAMINE 30 MG/ML IJ SOLN
30.0000 mg | Freq: Once | INTRAMUSCULAR | Status: AC
  Administered 2012-06-15: 30 mg via INTRAVENOUS
  Filled 2012-06-15: qty 1

## 2012-06-15 NOTE — ED Notes (Signed)
Pt's wife reports pt "got up today and threw out his back"- pt has hx of strokes and has communication deficits- pt vomited x 1 after arrival- pt anxious, states "i don't want to stay"- tremor noted

## 2012-06-15 NOTE — ED Notes (Signed)
Pt d/c home with family- denies pain at time of d/c

## 2012-06-15 NOTE — ED Provider Notes (Signed)
History   This chart was scribed for Hyun Marsalis B. Bernette Mayers, MD by Sofie Rower, ED Scribe. The patient was seen in room MH09/MH09 and the patient's care was started at 9:30PM.     CSN: [id number]  Arrival date & time 06/15/12  2006   First MD Initiated Contact with Patient 06/15/12 2130      Chief Complaint  Patient presents with  . Back Pain    (Consider location/radiation/quality/duration/timing/severity/associated sxs/prior treatment) Patient is a 61 y.o. male presenting with back pain. The history is provided by the patient and the spouse. No language interpreter was used.  Back Pain     Robert Meadows is a 61 y.o. male , with a hx of stroke (Multiple episodes in September and October 2013), who presents to the Emergency Department complaining of sudden, progressively worsening, back pain, located at the right lumbar spine, onset today (06/15/12).  Associated symptoms include vomiting (X 1 today).. The pt reports he was sitting down today, and upon standing from the seated position, he injured his back. Modifying factors include certain movements and positions of the back which intensify the back pain. The pt has a hx of hypertension, pancreatitis, MI, thyroid cancer, thyroidectomy, knee surgery, and lumbar epidural injection.  The pt does not smoke or drink alcohol.      Past Medical History  Diagnosis Date  . Hypertension   . Pancreatitis   . Myocardial infarct   . Thyroid cancer   . Diabetes mellitus without complication   . Stroke     multiple in sept and oct 2013    Past Surgical History  Procedure Date  . Thyroidectomy   . Pancreas surgery   . Knee surgery   . Lumbar epidural injection     No family history on file.  History  Substance Use Topics  . Smoking status: Former Smoker    Quit date: 06/08/2012  . Smokeless tobacco: Never Used  . Alcohol Use: No      Review of Systems  Musculoskeletal: Positive for back pain.  All other systems reviewed and are  negative.    Allergies  Ivp dye and Spinach  Home Medications   Current Outpatient Rx  Name  Route  Sig  Dispense  Refill  . ALPRAZOLAM PO   Oral   Take by mouth 3 (three) times daily.         . SERTRALINE HCL PO   Oral   Take by mouth at bedtime.         Marland Kitchen AMLODIPINE BESYLATE 10 MG PO TABS   Oral   Take 10 mg by mouth every morning.          . ASPIRIN 325 MG PO TBEC   Oral   Take 325 mg by mouth every morning.          . ATENOLOL 100 MG PO TABS   Oral   Take 100 mg by mouth every morning.          . ATORVASTATIN CALCIUM 20 MG PO TABS   Oral   Take 20 mg by mouth at bedtime.           Marland Kitchen CLONIDINE HCL 0.2 MG PO TABS   Oral   Take 0.2 mg by mouth at bedtime.           . CLOPIDOGREL BISULFATE 75 MG PO TABS   Oral   Take 75 mg by mouth every morning.          Marland Kitchen  ESOMEPRAZOLE MAGNESIUM 40 MG PO CPDR   Oral   Take 40 mg by mouth daily before breakfast.           . FENTANYL 75 MCG/HR TD PT72   Transdermal   Place 1 patch onto the skin every 3 (three) days.         . FUROSEMIDE 40 MG PO TABS   Oral   Take 40 mg by mouth every morning.          Marland Kitchen GABAPENTIN 600 MG PO TABS   Oral   Take 900 mg by mouth 3 (three) times daily as needed. Nerve pain         . HYDROCODONE-ACETAMINOPHEN 5-325 MG PO TABS   Oral   Take 1 tablet by mouth every 6 (six) hours as needed. pain          . HYDROCORTISONE 2.5 % EX CREA   Topical   Apply topically 2 (two) times daily as needed. Hemorids          . IMIPRAMINE HCL 25 MG PO TABS   Oral   Take 25 mg by mouth at bedtime.           . ISOSORBIDE MONONITRATE ER 30 MG PO TB24   Oral   Take 15 mg by mouth every morning.          Marland Kitchen LEVOTHYROXINE SODIUM 300 MCG PO TABS   Oral   Take 300 mcg by mouth daily.           Marland Kitchen LIDOCAINE 5 % EX PTCH   Transdermal   Place 1 patch onto the skin daily. Remove & Discard patch within 12 hours or as directed by MD         . Claris Gladden POTASSIUM-HCTZ 100-25  MG PO TABS   Oral   Take 1 tablet by mouth every morning.          Marland Kitchen METFORMIN HCL 500 MG PO TABS   Oral   Take 500 mg by mouth 2 (two) times daily with a meal.         . NITROGLYCERIN 0.4 MG SL SUBL   Sublingual   Place 0.4 mg under the tongue every 5 (five) minutes x 3 doses as needed. Chest pain         . PANCRELIPASE (LIP-PROT-AMYL) 5000 UNITS PO CPEP   Oral   Take 3 capsules by mouth at bedtime.           Marland Kitchen POTASSIUM CHLORIDE 10 MEQ PO TBCR   Oral   Take 10 mEq by mouth every morning.          Marland Kitchen PROMETHAZINE HCL 25 MG PO TABS   Oral   Take 25 mg by mouth every 6 (six) hours as needed. nausea           BP 170/101  Pulse 69  Temp 98.5 F (36.9 C) (Oral)  Resp 23  SpO2 100%  Physical Exam  Nursing note and vitals reviewed. Constitutional: He is oriented to person, place, and time. He appears well-developed and well-nourished.  HENT:  Head: Normocephalic and atraumatic.  Eyes: EOM are normal. Pupils are equal, round, and reactive to light.  Neck: Normal range of motion. Neck supple.  Cardiovascular: Normal rate, normal heart sounds and intact distal pulses.   Pulmonary/Chest: Effort normal and breath sounds normal.  Abdominal: Bowel sounds are normal. He exhibits no distension. There is no tenderness.  Musculoskeletal: Normal range of motion. He  exhibits tenderness. He exhibits no edema.       Lumbar back: He exhibits tenderness.       Right lumbar, Right sciatic notch tenderness detected.   Neurological: He is alert and oriented to person, place, and time. He has normal strength. No cranial nerve deficit or sensory deficit.  Skin: Skin is warm and dry. No rash noted.  Psychiatric: He has a normal mood and affect.    ED Course  Procedures (including critical care time)  DIAGNOSTIC STUDIES: Oxygen Saturation is 100% on room air, normal by my interpretation.    COORDINATION OF CARE:   9:37 PM- Treatment plan discussed with patient. Pt agrees with  treatment.   10:32 PM- Recheck. Pt reports feeling better. Treatment plan discussed with patient. Pt agrees with treatment.        Labs Reviewed - No data to display No results found.   No diagnosis found.    MDM  Pain improved after meds. He is eager to go home. Has fentanyl patches at home.       I personally performed the services described in this documentation, which was scribed in my presence. The recorded information has been reviewed and is accurate.     Leila Schuff B. Bernette Mayers, MD 06/15/12 [phone]

## 2012-08-12 ENCOUNTER — Emergency Department (HOSPITAL_BASED_OUTPATIENT_CLINIC_OR_DEPARTMENT_OTHER)
Admission: EM | Admit: 2012-08-12 | Discharge: 2012-08-12 | Disposition: A | Discharge status: Home / Self Care | Payer: No Typology Code available for payment source | Attending: Emergency Medicine | Admitting: Emergency Medicine

## 2012-08-12 ENCOUNTER — Emergency Department (HOSPITAL_BASED_OUTPATIENT_CLINIC_OR_DEPARTMENT_OTHER): Payer: No Typology Code available for payment source

## 2012-08-12 ENCOUNTER — Encounter (HOSPITAL_BASED_OUTPATIENT_CLINIC_OR_DEPARTMENT_OTHER): Payer: Self-pay | Admitting: *Deleted

## 2012-08-12 DIAGNOSIS — R1013 Epigastric pain: Secondary | ICD-10-CM | POA: Insufficient documentation

## 2012-08-12 DIAGNOSIS — I69998 Other sequelae following unspecified cerebrovascular disease: Secondary | ICD-10-CM | POA: Insufficient documentation

## 2012-08-12 DIAGNOSIS — Y92410 Unspecified street and highway as the place of occurrence of the external cause: Secondary | ICD-10-CM | POA: Insufficient documentation

## 2012-08-12 DIAGNOSIS — K861 Other chronic pancreatitis: Secondary | ICD-10-CM | POA: Insufficient documentation

## 2012-08-12 DIAGNOSIS — Z79899 Other long term (current) drug therapy: Secondary | ICD-10-CM | POA: Insufficient documentation

## 2012-08-12 DIAGNOSIS — Z87891 Personal history of nicotine dependence: Secondary | ICD-10-CM | POA: Insufficient documentation

## 2012-08-12 DIAGNOSIS — I252 Old myocardial infarction: Secondary | ICD-10-CM | POA: Insufficient documentation

## 2012-08-12 DIAGNOSIS — Z7982 Long term (current) use of aspirin: Secondary | ICD-10-CM | POA: Insufficient documentation

## 2012-08-12 DIAGNOSIS — G8929 Other chronic pain: Secondary | ICD-10-CM | POA: Insufficient documentation

## 2012-08-12 DIAGNOSIS — S139XXA Sprain of joints and ligaments of unspecified parts of neck, initial encounter: Secondary | ICD-10-CM | POA: Insufficient documentation

## 2012-08-12 DIAGNOSIS — S161XXA Strain of muscle, fascia and tendon at neck level, initial encounter: Secondary | ICD-10-CM

## 2012-08-12 DIAGNOSIS — I10 Essential (primary) hypertension: Secondary | ICD-10-CM | POA: Insufficient documentation

## 2012-08-12 DIAGNOSIS — S8000XA Contusion of unspecified knee, initial encounter: Secondary | ICD-10-CM | POA: Insufficient documentation

## 2012-08-12 DIAGNOSIS — Y9389 Activity, other specified: Secondary | ICD-10-CM | POA: Insufficient documentation

## 2012-08-12 DIAGNOSIS — M6281 Muscle weakness (generalized): Secondary | ICD-10-CM | POA: Insufficient documentation

## 2012-08-12 DIAGNOSIS — M545 Low back pain, unspecified: Secondary | ICD-10-CM | POA: Insufficient documentation

## 2012-08-12 DIAGNOSIS — R209 Unspecified disturbances of skin sensation: Secondary | ICD-10-CM | POA: Insufficient documentation

## 2012-08-12 DIAGNOSIS — Z85850 Personal history of malignant neoplasm of thyroid: Secondary | ICD-10-CM | POA: Insufficient documentation

## 2012-08-12 DIAGNOSIS — S0990XA Unspecified injury of head, initial encounter: Secondary | ICD-10-CM

## 2012-08-12 DIAGNOSIS — E119 Type 2 diabetes mellitus without complications: Secondary | ICD-10-CM | POA: Insufficient documentation

## 2012-08-12 MED ORDER — HYDROCODONE-ACETAMINOPHEN 5-325 MG PO TABS: 1.0000 | ORAL_TABLET | Freq: Four times a day (QID) | ORAL | Status: DC | PRN
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
## 2013-02-02 ENCOUNTER — Emergency Department (HOSPITAL_BASED_OUTPATIENT_CLINIC_OR_DEPARTMENT_OTHER)
Admission: EM | Admit: 2013-02-02 | Discharge: 2013-02-02 | Disposition: A | Discharge status: Home / Self Care | Payer: Medicare Other | Attending: Emergency Medicine | Admitting: Emergency Medicine

## 2013-02-02 ENCOUNTER — Encounter (HOSPITAL_BASED_OUTPATIENT_CLINIC_OR_DEPARTMENT_OTHER): Payer: Self-pay | Admitting: *Deleted

## 2013-02-02 DIAGNOSIS — I252 Old myocardial infarction: Secondary | ICD-10-CM | POA: Insufficient documentation

## 2013-02-02 DIAGNOSIS — Z8673 Personal history of transient ischemic attack (TIA), and cerebral infarction without residual deficits: Secondary | ICD-10-CM | POA: Insufficient documentation

## 2013-02-02 DIAGNOSIS — Z79899 Other long term (current) drug therapy: Secondary | ICD-10-CM | POA: Insufficient documentation

## 2013-02-02 DIAGNOSIS — G8929 Other chronic pain: Secondary | ICD-10-CM | POA: Insufficient documentation

## 2013-02-02 DIAGNOSIS — Z85850 Personal history of malignant neoplasm of thyroid: Secondary | ICD-10-CM | POA: Insufficient documentation

## 2013-02-02 DIAGNOSIS — K861 Other chronic pancreatitis: Secondary | ICD-10-CM | POA: Insufficient documentation

## 2013-02-02 DIAGNOSIS — E119 Type 2 diabetes mellitus without complications: Secondary | ICD-10-CM | POA: Insufficient documentation

## 2013-02-02 DIAGNOSIS — Z87891 Personal history of nicotine dependence: Secondary | ICD-10-CM | POA: Insufficient documentation

## 2013-02-02 DIAGNOSIS — R1013 Epigastric pain: Secondary | ICD-10-CM | POA: Insufficient documentation

## 2013-02-02 DIAGNOSIS — Z7982 Long term (current) use of aspirin: Secondary | ICD-10-CM | POA: Insufficient documentation

## 2013-02-02 DIAGNOSIS — I10 Essential (primary) hypertension: Secondary | ICD-10-CM | POA: Insufficient documentation

## 2013-02-02 DIAGNOSIS — Z7902 Long term (current) use of antithrombotics/antiplatelets: Secondary | ICD-10-CM | POA: Insufficient documentation

## 2013-02-02 LAB — COMPREHENSIVE METABOLIC PANEL
Albumin: 4.1 g/dL (ref 3.5–5.2)
BUN: 12 mg/dL (ref 6–23)
Creatinine, Ser: 1.3 mg/dL (ref 0.50–1.35)
Potassium: 4.1 mEq/L (ref 3.5–5.1)
Total Protein: 8 g/dL (ref 6.0–8.3)

## 2013-02-02 LAB — URINALYSIS W MICROSCOPIC + REFLEX CULTURE
Leukocytes, UA: NEGATIVE
Nitrite: NEGATIVE
Specific Gravity, Urine: 1.007 (ref 1.005–1.030)
pH: 6.5 (ref 5.0–8.0)

## 2013-02-02 LAB — CBC WITH DIFFERENTIAL/PLATELET
Basophils Relative: 1 % (ref 0–1)
Eosinophils Absolute: 0.2 10*3/uL (ref 0.0–0.7)
Hemoglobin: 12.2 g/dL — ABNORMAL LOW (ref 13.0–17.0)
MCH: 23.4 pg — ABNORMAL LOW (ref 26.0–34.0)
MCHC: 32.8 g/dL (ref 30.0–36.0)
Monocytes Absolute: 0.6 10*3/uL (ref 0.1–1.0)
Monocytes Relative: 9 % (ref 3–12)
Neutrophils Relative %: 40 % — ABNORMAL LOW (ref 43–77)

## 2013-02-02 LAB — LIPASE, BLOOD: Lipase: 27 U/L (ref 11–59)

## 2013-02-02 MED ORDER — HYDROMORPHONE HCL PF 1 MG/ML IJ SOLN
1.0000 mg | Freq: Once | INTRAMUSCULAR | Status: AC
  Administered 2013-02-02: 1 mg via INTRAVENOUS
  Filled 2013-02-02: qty 1

## 2013-02-02 MED ORDER — SODIUM CHLORIDE 0.9 % IV SOLN
Freq: Once | INTRAVENOUS | Status: AC
  Administered 2013-02-02: 15:00:00 via INTRAVENOUS

## 2013-02-02 MED ORDER — HYDROCODONE-ACETAMINOPHEN 5-325 MG PO TABS: 1.0000 | ORAL_TABLET | Freq: Four times a day (QID) | ORAL | Status: DC | PRN

## 2013-02-02 MED ORDER — ONDANSETRON HCL 4 MG/2ML IJ SOLN
4.0000 mg | Freq: Once | INTRAMUSCULAR | Status: AC
  Administered 2013-02-02: 4 mg via INTRAVENOUS
  Filled 2013-02-02: qty 2

## 2013-02-02 NOTE — ED Provider Notes (Signed)
History    CSN: [id number] Arrival date & time 02/02/13  1410  First MD Initiated Contact with Patient 02/02/13 1507     Chief Complaint  Patient presents with  . Abdominal Pain   (Consider location/radiation/quality/duration/timing/severity/associated sxs/prior Treatment) HPI Robert Meadows is a 62 y.o. male who presents to ED with abdominal pain. States pain began 3 days ago, each time improeved with pain medications,  this morning worsened while at church. Pain is sharp. In epigastric area. Nothing making pain better or worse. No nausea, vomiting, diarrhea. Stats hx of pancreatitis. Feels the same. States ran out of pain medications.  Past Medical History  Diagnosis Date  . Hypertension   . Pancreatitis   . Myocardial infarct   . Thyroid cancer   . Diabetes mellitus without complication   . Stroke     multiple in sept and oct 2013   Past Surgical History  Procedure Laterality Date  . Thyroidectomy    . Pancreas surgery    . Knee surgery    . Lumbar epidural injection     No family history on file. History  Substance Use Topics  . Smoking status: Former Smoker    Quit date: 06/08/2012  . Smokeless tobacco: Never Used  . Alcohol Use: No    Review of Systems  Constitutional: Negative for fever and chills.  Respiratory: Negative.   Cardiovascular: Negative.   Gastrointestinal: Positive for abdominal pain. Negative for nausea, vomiting, diarrhea, constipation and blood in stool.  Genitourinary: Negative for dysuria and flank pain.  Musculoskeletal: Negative.   Skin: Negative.   Neurological: Negative for weakness, numbness and headaches.  All other systems reviewed and are negative.    Allergies  Ivp dye and Spinach  Home Medications   Current Outpatient Rx  Name  Route  Sig  Dispense  Refill  . ALPRAZOLAM PO   Oral   Take by mouth 3 (three) times daily.         Marland Kitchen amLODipine (NORVASC) 10 MG tablet   Oral   Take 10 mg by mouth every morning.           Marland Kitchen aspirin 325 MG EC tablet   Oral   Take 81 mg by mouth every morning.          Marland Kitchen atenolol (TENORMIN) 100 MG tablet   Oral   Take 100 mg by mouth every morning.          Marland Kitchen atorvastatin (LIPITOR) 20 MG tablet   Oral   Take 20 mg by mouth at bedtime.           . cloNIDine (CATAPRES) 0.2 MG tablet   Oral   Take 0.2 mg by mouth at bedtime.           . clopidogrel (PLAVIX) 75 MG tablet   Oral   Take 75 mg by mouth every morning.          . Diazepam (VALIUM PO)   Oral   Take by mouth.         . esomeprazole (NEXIUM) 40 MG capsule   Oral   Take 40 mg by mouth daily before breakfast.           . fentaNYL (DURAGESIC - DOSED MCG/HR) 75 MCG/HR   Transdermal   Place 1 patch onto the skin every 3 (three) days.         . furosemide (LASIX) 40 MG tablet   Oral   Take 40 mg by mouth  every morning.          . gabapentin (NEURONTIN) 600 MG tablet   Oral   Take 900 mg by mouth 3 (three) times daily as needed. Nerve pain         . HYDROcodone-acetaminophen (NORCO) 5-325 MG per tablet   Oral   Take 1 tablet by mouth every 6 (six) hours as needed. pain          . HYDROcodone-acetaminophen (NORCO) 5-325 MG per tablet   Oral   Take 1 tablet by mouth every 6 (six) hours as needed for pain.   10 tablet   0   . hydrocortisone 2.5 % cream   Topical   Apply topically 2 (two) times daily as needed. Hemorids          . imipramine (TOFRANIL) 25 MG tablet   Oral   Take 25 mg by mouth at bedtime.           . isosorbide mononitrate (IMDUR) 30 MG 24 hr tablet   Oral   Take 15 mg by mouth every morning.          Marland Kitchen levothyroxine (SYNTHROID, LEVOTHROID) 300 MCG tablet   Oral   Take 300 mcg by mouth daily.           Marland Kitchen lidocaine (LIDODERM) 5 %   Transdermal   Place 1 patch onto the skin daily. Remove & Discard patch within 12 hours or as directed by MD         . losartan-hydrochlorothiazide (HYZAAR) 100-25 MG per tablet   Oral   Take 1 tablet by  mouth every morning.          . metFORMIN (GLUCOPHAGE) 500 MG tablet   Oral   Take 500 mg by mouth 2 (two) times daily with a meal.         . nitroGLYCERIN (NITROSTAT) 0.4 MG SL tablet   Sublingual   Place 0.4 mg under the tongue every 5 (five) minutes x 3 doses as needed. Chest pain         . Pancrelipase, Lip-Prot-Amyl, (ZENPEP) 5000 UNITS CPEP   Oral   Take 3 capsules by mouth at bedtime.           . potassium chloride (KLOR-CON) 10 MEQ CR tablet   Oral   Take 10 mEq by mouth every morning.          . promethazine (PHENERGAN) 25 MG tablet   Oral   Take 25 mg by mouth every 6 (six) hours as needed. nausea         . SERTRALINE HCL PO   Oral   Take by mouth at bedtime.          BP 147/87  Pulse 56  Temp(Src) 98.6 F (37 C) (Oral)  Resp 18  SpO2 97% Physical Exam  Nursing note and vitals reviewed. Constitutional: He appears well-developed and well-nourished. No distress.  HENT:  Head: Normocephalic.  Eyes: Conjunctivae are normal.  Cardiovascular: Normal rate, regular rhythm and normal heart sounds.   Pulmonary/Chest: Effort normal and breath sounds normal. No respiratory distress. He has no wheezes. He has no rales.  Abdominal: Soft. Bowel sounds are normal. He exhibits no distension. There is tenderness. There is no rebound and no guarding.  Epigastric tenderness  Musculoskeletal: He exhibits no edema.  Neurological: He is alert.  Skin: Skin is dry.    ED Course  Procedures (including critical care time)  Results for orders placed  during the hospital encounter of 02/02/13  CBC WITH DIFFERENTIAL      Result Value Range   WBC 5.9  4.0 - 10.5 K/uL   RBC 5.21  4.22 - 5.81 MIL/uL   Hemoglobin 12.2 (*) 13.0 - 17.0 g/dL   HCT 16.1 (*) 09.6 - 04.5 %   MCV 71.4 (*) 78.0 - 100.0 fL   MCH 23.4 (*) 26.0 - 34.0 pg   MCHC 32.8  30.0 - 36.0 g/dL   RDW 40.9 (*) 81.1 - 91.4 %   Platelets 226  150 - 400 K/uL   Neutrophils Relative % 40 (*) 43 - 77 %    Neutro Abs 2.4  1.7 - 7.7 K/uL   Lymphocytes Relative 46  12 - 46 %   Lymphs Abs 2.7  0.7 - 4.0 K/uL   Monocytes Relative 9  3 - 12 %   Monocytes Absolute 0.6  0.1 - 1.0 K/uL   Eosinophils Relative 3  0 - 5 %   Eosinophils Absolute 0.2  0.0 - 0.7 K/uL   Basophils Relative 1  0 - 1 %   Basophils Absolute 0.1  0.0 - 0.1 K/uL  COMPREHENSIVE METABOLIC PANEL      Result Value Range   Sodium 138  135 - 145 mEq/L   Potassium 4.1  3.5 - 5.1 mEq/L   Chloride 100  96 - 112 mEq/L   CO2 28  19 - 32 mEq/L   Glucose, Bld 99  70 - 99 mg/dL   BUN 12  6 - 23 mg/dL   Creatinine, Ser 7.82  0.50 - 1.35 mg/dL   Calcium 9.4  8.4 - 95.6 mg/dL   Total Protein 8.0  6.0 - 8.3 g/dL   Albumin 4.1  3.5 - 5.2 g/dL   AST 38 (*) 0 - 37 U/L   ALT 40  0 - 53 U/L   Alkaline Phosphatase 66  39 - 117 U/L   Total Bilirubin 0.3  0.3 - 1.2 mg/dL   GFR calc non Af Amer 58 (*) >90 mL/min   GFR calc Af Amer 67 (*) >90 mL/min  LIPASE, BLOOD      Result Value Range   Lipase 27  11 - 59 U/L  URINALYSIS W MICROSCOPIC + REFLEX CULTURE      Result Value Range   Color, Urine YELLOW  YELLOW   APPearance CLEAR  CLEAR   Specific Gravity, Urine 1.007  1.005 - 1.030   pH 6.5  5.0 - 8.0   Glucose, UA NEGATIVE  NEGATIVE mg/dL   Hgb urine dipstick MODERATE (*) NEGATIVE   Bilirubin Urine NEGATIVE  NEGATIVE   Ketones, ur NEGATIVE  NEGATIVE mg/dL   Protein, ur NEGATIVE  NEGATIVE mg/dL   Urobilinogen, UA 0.2  0.0 - 1.0 mg/dL   Nitrite NEGATIVE  NEGATIVE   Leukocytes, UA NEGATIVE  NEGATIVE   RBC / HPF 11-20  <3 RBC/hpf   Bacteria, UA RARE  RARE   Squamous Epithelial / LPF RARE  RARE   No results found.   No results found.  1. Chronic abdominal pain     MDM  Pt with chronic pancreatitis. Labs unremarkable here. Abdomen is soft, no guarding, no peritoneal symptoms. No fever. No elevated wbc. Pt feels better with iv fluids and pain medications. Will d/c home with close follow up with pcp. Pt asking for pain mediations at  home, norco prescribed.   Filed Vitals:   02/02/13 1530 02/02/13 1600 02/02/13 1630 02/02/13  1700  BP: 151/97 147/87 150/93 152/91  Pulse: 59 56 57 54  Temp:      TempSrc:      Resp:      SpO2: 97% 97% 99% 97%     Auda Finfrock A Twanda Stakes, PA-C 02/02/13 2348

## 2013-02-02 NOTE — ED Notes (Signed)
Patient has chronic pancreatitis and states he is having an episode.

## 2013-02-03 NOTE — ED Provider Notes (Signed)
Medical screening examination/treatment/procedure(s) were performed by non-physician practitioner and as supervising physician I was immediately available for consultation/collaboration.   Rolan Bucco, MD 02/03/13 [phone]

## 2013-05-18 ENCOUNTER — Emergency Department (HOSPITAL_BASED_OUTPATIENT_CLINIC_OR_DEPARTMENT_OTHER)
Admission: EM | Admit: 2013-05-18 | Discharge: 2013-05-18 | Disposition: A | Discharge status: Home / Self Care | Payer: Medicare Other | Attending: Emergency Medicine | Admitting: Emergency Medicine

## 2013-05-18 ENCOUNTER — Emergency Department (HOSPITAL_BASED_OUTPATIENT_CLINIC_OR_DEPARTMENT_OTHER): Payer: Medicare Other

## 2013-05-18 ENCOUNTER — Encounter (HOSPITAL_BASED_OUTPATIENT_CLINIC_OR_DEPARTMENT_OTHER): Payer: Self-pay | Admitting: Emergency Medicine

## 2013-05-18 DIAGNOSIS — Z79899 Other long term (current) drug therapy: Secondary | ICD-10-CM | POA: Insufficient documentation

## 2013-05-18 DIAGNOSIS — Z85850 Personal history of malignant neoplasm of thyroid: Secondary | ICD-10-CM | POA: Insufficient documentation

## 2013-05-18 DIAGNOSIS — Z7982 Long term (current) use of aspirin: Secondary | ICD-10-CM | POA: Insufficient documentation

## 2013-05-18 DIAGNOSIS — E119 Type 2 diabetes mellitus without complications: Secondary | ICD-10-CM | POA: Insufficient documentation

## 2013-05-18 DIAGNOSIS — I10 Essential (primary) hypertension: Secondary | ICD-10-CM | POA: Insufficient documentation

## 2013-05-18 DIAGNOSIS — Z87891 Personal history of nicotine dependence: Secondary | ICD-10-CM | POA: Insufficient documentation

## 2013-05-18 DIAGNOSIS — Z9861 Coronary angioplasty status: Secondary | ICD-10-CM | POA: Insufficient documentation

## 2013-05-18 DIAGNOSIS — R51 Headache: Secondary | ICD-10-CM | POA: Insufficient documentation

## 2013-05-18 DIAGNOSIS — I252 Old myocardial infarction: Secondary | ICD-10-CM | POA: Insufficient documentation

## 2013-05-18 DIAGNOSIS — Z8673 Personal history of transient ischemic attack (TIA), and cerebral infarction without residual deficits: Secondary | ICD-10-CM | POA: Insufficient documentation

## 2013-05-18 LAB — BASIC METABOLIC PANEL
Chloride: 103 mEq/L (ref 96–112)
Creatinine, Ser: 1.7 mg/dL — ABNORMAL HIGH (ref 0.50–1.35)
GFR calc Af Amer: 49 mL/min — ABNORMAL LOW (ref >90)
GFR calc non Af Amer: 42 mL/min — ABNORMAL LOW (ref >90)

## 2013-05-18 LAB — CBC WITH DIFFERENTIAL/PLATELET
Basophils Absolute: 0.1 10*3/uL (ref 0.0–0.1)
Basophils Relative: 1 % (ref 0–1)
Eosinophils Absolute: 0.2 10*3/uL (ref 0.0–0.7)
HCT: 35.9 % — ABNORMAL LOW (ref 39.0–52.0)
MCHC: 32.9 g/dL (ref 30.0–36.0)
Monocytes Absolute: 0.8 10*3/uL (ref 0.1–1.0)
Neutro Abs: 3.6 10*3/uL (ref 1.7–7.7)
Neutrophils Relative %: 50 % (ref 43–77)
RDW: 16.6 % — ABNORMAL HIGH (ref 11.5–15.5)

## 2013-05-18 LAB — SEDIMENTATION RATE: Sed Rate: 18 mm/hr — ABNORMAL HIGH (ref 0–16)

## 2013-05-18 MED ORDER — HYDROCODONE-ACETAMINOPHEN 5-325 MG PO TABS: 2.0000 | ORAL_TABLET | ORAL | Status: DC | PRN

## 2013-05-18 MED ORDER — CARBAMAZEPINE ER 100 MG PO TB12: 100.0000 mg | ORAL_TABLET | Freq: Two times a day (BID) | ORAL | Status: DC

## 2013-05-18 MED ORDER — HYDROCODONE-ACETAMINOPHEN 5-325 MG PO TABS
2.0000 | ORAL_TABLET | Freq: Once | ORAL | Status: AC
  Administered 2013-05-18: 2 via ORAL
  Filled 2013-05-18: qty 2

## 2013-05-18 MED ORDER — KETOROLAC TROMETHAMINE 30 MG/ML IJ SOLN
15.0000 mg | Freq: Once | INTRAMUSCULAR | Status: AC
  Administered 2013-05-18: 15 mg via INTRAVENOUS
  Filled 2013-05-18: qty 1

## 2013-05-18 NOTE — ED Notes (Signed)
Right side Head pain x 1 week- spoke with PCP on Friday and was advised to come to emergency room-

## 2013-05-18 NOTE — ED Provider Notes (Signed)
CSN: [id number]     Arrival date & time 05/18/13  1741 History  This chart was scribed for Robert Crease, MD by Greggory Stallion, ED Scribe. This patient was seen in room MH10/MH10 and the patient's care was started at 6:34 PM.   Chief Complaint  Patient presents with  . Headache   The history is provided by the patient. No language interpreter was used.   HPI Comments: Robert Meadows is a 62 y.o. male who presents to the Emergency Department complaining of gradual onset, intermittent right sided headache that started one week ago. Pt states it lasts for 2-3 seconds when it does come and it shoots into his ear and it causes his eye to close. He states he has taken 3 Tylenol and a muscle relaxer with no relief. He states he saw his PCP 2 days ago and was advised to come to the ED. Pt denies ear pain, nausea, visual disturbance, numbness, weakness, hearing change and tinnitus.   Past Medical History  Diagnosis Date  . Hypertension   . Pancreatitis   . Myocardial infarct   . Thyroid cancer   . Diabetes mellitus without complication   . Stroke     multiple in sept and oct 2013   Past Surgical History  Procedure Laterality Date  . Thyroidectomy    . Pancreas surgery    . Knee surgery    . Lumbar epidural injection    . Angioplasty     No family history on file. History  Substance Use Topics  . Smoking status: Former Smoker    Quit date: 06/08/2012  . Smokeless tobacco: Never Used  . Alcohol Use: No    Review of Systems  HENT: Negative for ear pain, hearing loss and tinnitus.   Eyes: Negative for visual disturbance.  Gastrointestinal: Negative for nausea.  Neurological: Positive for headaches. Negative for weakness and numbness.  All other systems reviewed and are negative.    Allergies  Ivp dye and Spinach  Home Medications   Current Outpatient Rx  Name  Route  Sig  Dispense  Refill  . amLODipine (NORVASC) 10 MG tablet   Oral   Take 10 mg by mouth every  morning.          Marland Kitchen aspirin 325 MG EC tablet   Oral   Take 81 mg by mouth every morning.          Marland Kitchen atenolol (TENORMIN) 100 MG tablet   Oral   Take 100 mg by mouth every morning.          Marland Kitchen esomeprazole (NEXIUM) 40 MG capsule   Oral   Take 40 mg by mouth daily before breakfast.           . fentaNYL (DURAGESIC - DOSED MCG/HR) 75 MCG/HR   Transdermal   Place 1 patch onto the skin every 3 (three) days.         . furosemide (LASIX) 40 MG tablet   Oral   Take 40 mg by mouth every morning.          . hydrocortisone 2.5 % cream   Topical   Apply topically 2 (two) times daily as needed. Hemorids          . isosorbide mononitrate (IMDUR) 30 MG 24 hr tablet   Oral   Take 15 mg by mouth every morning.          Marland Kitchen levothyroxine (SYNTHROID, LEVOTHROID) 300 MCG tablet   Oral  Take 300 mcg by mouth daily.           Marland Kitchen losartan-hydrochlorothiazide (HYZAAR) 100-25 MG per tablet   Oral   Take 1 tablet by mouth every morning.          . metFORMIN (GLUCOPHAGE) 500 MG tablet   Oral   Take 500 mg by mouth 2 (two) times daily with a meal.         . nitroGLYCERIN (NITROSTAT) 0.4 MG SL tablet   Sublingual   Place 0.4 mg under the tongue every 5 (five) minutes x 3 doses as needed. Chest pain         . ondansetron (ZOFRAN) 4 MG tablet   Oral   Take 4 mg by mouth every 8 (eight) hours as needed for nausea.         . Pancrelipase, Lip-Prot-Amyl, (ZENPEP) 5000 UNITS CPEP   Oral   Take 3 capsules by mouth at bedtime.           . promethazine (PHENERGAN) 25 MG tablet   Oral   Take 25 mg by mouth every 6 (six) hours as needed. nausea         . SERTRALINE HCL PO   Oral   Take by mouth at bedtime.         . ALPRAZOLAM PO   Oral   Take by mouth 3 (three) times daily.         Marland Kitchen atorvastatin (LIPITOR) 20 MG tablet   Oral   Take 20 mg by mouth at bedtime.           . cloNIDine (CATAPRES) 0.2 MG tablet   Oral   Take 0.2 mg by mouth at bedtime.            . clopidogrel (PLAVIX) 75 MG tablet   Oral   Take 75 mg by mouth every morning.          . Diazepam (VALIUM PO)   Oral   Take by mouth.         . gabapentin (NEURONTIN) 600 MG tablet   Oral   Take 900 mg by mouth 3 (three) times daily as needed. Nerve pain         . HYDROcodone-acetaminophen (NORCO) 5-325 MG per tablet   Oral   Take 1 tablet by mouth every 6 (six) hours as needed. pain          . HYDROcodone-acetaminophen (NORCO) 5-325 MG per tablet   Oral   Take 1 tablet by mouth every 6 (six) hours as needed for pain.   10 tablet   0   . HYDROcodone-acetaminophen (NORCO) 5-325 MG per tablet   Oral   Take 1 tablet by mouth every 6 (six) hours as needed for pain.   20 tablet   0   . imipramine (TOFRANIL) 25 MG tablet   Oral   Take 25 mg by mouth at bedtime.           . lidocaine (LIDODERM) 5 %   Transdermal   Place 1 patch onto the skin daily. Remove & Discard patch within 12 hours or as directed by MD         . potassium chloride (KLOR-CON) 10 MEQ CR tablet   Oral   Take 10 mEq by mouth every morning.           BP 130/83  Pulse 60  Temp(Src) 98.1 F (36.7 C) (Oral)  Resp 16  Ht 6\' 1"  (  1.854 m)  Wt 268 lb (121.564 kg)  BMI 35.37 kg/m2  SpO2 98%  Physical Exam  Constitutional: He is oriented to person, place, and time. He appears well-developed and well-nourished. No distress.  HENT:  Head: Normocephalic and atraumatic.  Right Ear: Hearing normal.  Left Ear: Hearing normal.  Nose: Nose normal.  Mouth/Throat: Oropharynx is clear and moist and mucous membranes are normal.  Eyes: Conjunctivae and EOM are normal. Pupils are equal, round, and reactive to light.  Neck: Normal range of motion. Neck supple.  Cardiovascular: Regular rhythm, S1 normal and S2 normal.  Exam reveals no gallop and no friction rub.   No murmur heard. Pulmonary/Chest: Effort normal and breath sounds normal. No respiratory distress. He exhibits no tenderness.   Abdominal: Soft. Normal appearance and bowel sounds are normal. There is no hepatosplenomegaly. There is no tenderness. There is no rebound, no guarding, no tenderness at McBurney's point and negative Murphy's sign. No hernia.  Musculoskeletal: Normal range of motion.  Neurological: He is alert and oriented to person, place, and time. He has normal strength. No cranial nerve deficit or sensory deficit. Coordination normal. GCS eye subscore is 4. GCS verbal subscore is 5. GCS motor subscore is 6.  Skin: Skin is warm, dry and intact. No rash noted. No cyanosis.  Psychiatric: He has a normal mood and affect. His speech is normal and behavior is normal. Thought content normal.    ED Course  Procedures (including critical care time)  DIAGNOSTIC STUDIES: Oxygen Saturation is 98% on RA, normal by my interpretation.    COORDINATION OF CARE: 6:37 PM-Discussed treatment plan which includes xray and headache medication with pt at bedside and pt agreed to plan.   Labs Review Labs Reviewed  CBC WITH DIFFERENTIAL - Abnormal; Notable for the following:    Hemoglobin 11.8 (*)    HCT 35.9 (*)    MCV 70.0 (*)    MCH 23.0 (*)    RDW 16.6 (*)    All other components within normal limits  BASIC METABOLIC PANEL - Abnormal; Notable for the following:    Glucose, Bld 131 (*)    Creatinine, Ser 1.70 (*)    GFR calc non Af Amer 42 (*)    GFR calc Af Amer 49 (*)    All other components within normal limits  SEDIMENTATION RATE   Imaging Review Ct Head Wo Contrast  05/18/2013   CLINICAL DATA:  Headache.  EXAM: CT HEAD WITHOUT CONTRAST  TECHNIQUE: Contiguous axial images were obtained from the base of the skull through the vertex without intravenous contrast.  COMPARISON:  CT scan of January 05, 2013.  FINDINGS: Left maxillary mucous retention cyst is noted. Bony calvarium is intact. No mass effect or midline shift is noted. Ventricular size is within normal limits. There is no evidence of mass lesion,  hemorrhage or acute infarction.  IMPRESSION: No acute intracranial abnormality seen.   Electronically Signed   By: Roque Lias M.D.   On: 05/18/2013 19:44    EKG Interpretation   None       MDM  No diagnosis found.  Patient presents to the ER for evaluation of headache. Patient reports intermittent lancinating type pain on the right side of his scalp, forehead and eye area that is intermittent. Pain comes on suddenly, last for seconds and then goes away. It is severe when it is present. He has not had any vision change or jaw claudication. Sedimentation rate obtained to rule out temporal arteritis. Sedimentation  rate 18, negative. ET scan performed as well. Symptoms seem to be most consistent with possible trigeminal neuralgia. Initiate empiric treatment (tegretol, vicodin), followup with primary doctor in the office in the next couple of days for a recheck.     I personally performed the services described in this documentation, which was scribed in my presence. The recorded information has been reviewed and is accurate.    Robert Crease, MD 05/18/13 2039

## 2013-07-25 ENCOUNTER — Emergency Department (HOSPITAL_BASED_OUTPATIENT_CLINIC_OR_DEPARTMENT_OTHER)
Admission: EM | Admit: 2013-07-25 | Discharge: 2013-07-25 | Disposition: A | Discharge status: Home / Self Care | Payer: Medicare Other | Attending: Emergency Medicine | Admitting: Emergency Medicine

## 2013-07-25 ENCOUNTER — Encounter (HOSPITAL_BASED_OUTPATIENT_CLINIC_OR_DEPARTMENT_OTHER): Payer: Self-pay | Admitting: Emergency Medicine

## 2013-07-25 DIAGNOSIS — Z7902 Long term (current) use of antithrombotics/antiplatelets: Secondary | ICD-10-CM | POA: Insufficient documentation

## 2013-07-25 DIAGNOSIS — Z87891 Personal history of nicotine dependence: Secondary | ICD-10-CM | POA: Insufficient documentation

## 2013-07-25 DIAGNOSIS — I10 Essential (primary) hypertension: Secondary | ICD-10-CM | POA: Insufficient documentation

## 2013-07-25 DIAGNOSIS — Z7982 Long term (current) use of aspirin: Secondary | ICD-10-CM | POA: Insufficient documentation

## 2013-07-25 DIAGNOSIS — I252 Old myocardial infarction: Secondary | ICD-10-CM | POA: Insufficient documentation

## 2013-07-25 DIAGNOSIS — G8929 Other chronic pain: Secondary | ICD-10-CM | POA: Insufficient documentation

## 2013-07-25 DIAGNOSIS — E119 Type 2 diabetes mellitus without complications: Secondary | ICD-10-CM | POA: Insufficient documentation

## 2013-07-25 DIAGNOSIS — K859 Acute pancreatitis without necrosis or infection, unspecified: Secondary | ICD-10-CM | POA: Insufficient documentation

## 2013-07-25 DIAGNOSIS — M545 Low back pain, unspecified: Secondary | ICD-10-CM | POA: Insufficient documentation

## 2013-07-25 DIAGNOSIS — Z8673 Personal history of transient ischemic attack (TIA), and cerebral infarction without residual deficits: Secondary | ICD-10-CM | POA: Insufficient documentation

## 2013-07-25 DIAGNOSIS — Z79899 Other long term (current) drug therapy: Secondary | ICD-10-CM | POA: Insufficient documentation

## 2013-07-25 DIAGNOSIS — Z9889 Other specified postprocedural states: Secondary | ICD-10-CM | POA: Insufficient documentation

## 2013-07-25 DIAGNOSIS — Z85850 Personal history of malignant neoplasm of thyroid: Secondary | ICD-10-CM | POA: Insufficient documentation

## 2013-07-25 HISTORY — DX: Dorsalgia, unspecified: M54.9

## 2013-07-25 HISTORY — DX: Other chronic pain: G89.29

## 2013-07-25 MED ORDER — HYDROCODONE-ACETAMINOPHEN 5-325 MG PO TABS
2.0000 | ORAL_TABLET | Freq: Once | ORAL | Status: AC
  Administered 2013-07-25: 2 via ORAL
  Filled 2013-07-25: qty 2

## 2013-07-25 MED ORDER — HYDROCODONE-ACETAMINOPHEN 5-325 MG PO TABS: 1.0000 | ORAL_TABLET | Freq: Four times a day (QID) | ORAL | Status: DC | PRN

## 2013-07-25 NOTE — ED Notes (Signed)
Chronic back pain x 10 years. Ran of pain medication 2 weeks ago.

## 2013-07-25 NOTE — ED Provider Notes (Addendum)
CSN: [id number]     Arrival date & time 07/25/13  1850 History  This chart was scribed for Doug Sou, MD by Luisa Dago, ED Scribe. This patient was seen in room MH03/MH03 and the patient's care was started at 7:20 PM.    Chief Complaint  Patient presents with  . Back Pain   Patient is a 62 y.o. male presenting with back pain. The history is provided by the patient. No language interpreter was used.  Back Pain Location:  Lumbar spine Radiates to:  L knee Pain severity:  Moderate Pain is:  Same all the time Onset quality:  Gradual Duration:  3 months (chronic back pain for 10 year but worsened 3 months ago) Timing:  Constant Chronicity:  Chronic Associated symptoms: no bladder incontinence, no bowel incontinence and no fever    HPI Comments: Robert Meadows is a 62 y.o. Male with a history of chronic back pain over the past 10 years who presents to the Emergency Department complaining of  worsening lower back pain that radiates down to his left knee that worsened 3 months ago. Pt states that he will not be able to go see his PCP for additional pain medication until March 2015. Pt denies fever, bowel or bladder incontinence no fever no other associated symptoms. Patient has been in a pain clinic for the same type of back pain in the past however no longer goes due fact insurance won't pay for. Pt also reports a history of hypertension,pancreatitis, DM. Pt was a smoker he quit 4 months ago. Patient is a  No longer on alprazolam   Past Medical History  Diagnosis Date  . Hypertension   . Pancreatitis   . Myocardial infarct   . Thyroid cancer   . Diabetes mellitus without complication   . Stroke     multiple in sept and oct 2013  . Chronic back pain    Past Surgical History  Procedure Laterality Date  . Thyroidectomy    . Pancreas surgery    . Knee surgery    . Lumbar epidural injection    . Angioplasty     No family history on file. History  Substance Use Topics  .  Smoking status: Former Smoker    Quit date: 06/08/2012  . Smokeless tobacco: Never Used  . Alcohol Use: No    Review of Systems  Constitutional: Negative.  Negative for fever.  HENT: Negative.   Respiratory: Negative.   Cardiovascular: Negative.   Gastrointestinal: Negative.  Negative for bowel incontinence.  Genitourinary: Negative for bladder incontinence.  Musculoskeletal: Positive for back pain.  Skin: Negative.   Neurological: Negative.   Psychiatric/Behavioral: Negative.   All other systems reviewed and are negative.    Allergies  Ivp dye and Spinach  Home Medications   Current Outpatient Rx  Name  Route  Sig  Dispense  Refill  . ALPRAZOLAM PO   Oral   Take by mouth 3 (three) times daily.         Marland Kitchen amLODipine (NORVASC) 10 MG tablet   Oral   Take 10 mg by mouth every morning.          Marland Kitchen aspirin 325 MG EC tablet   Oral   Take 81 mg by mouth every morning.          Marland Kitchen atenolol (TENORMIN) 100 MG tablet   Oral   Take 100 mg by mouth every morning.          Marland Kitchen atorvastatin (LIPITOR)  20 MG tablet   Oral   Take 20 mg by mouth at bedtime.           . carbamazepine (TEGRETOL-XR) 100 MG 12 hr tablet   Oral   Take 1 tablet (100 mg total) by mouth 2 (two) times daily.   30 tablet   0   . cloNIDine (CATAPRES) 0.2 MG tablet   Oral   Take 0.2 mg by mouth at bedtime.           . clopidogrel (PLAVIX) 75 MG tablet   Oral   Take 75 mg by mouth every morning.          . Diazepam (VALIUM PO)   Oral   Take by mouth.         . esomeprazole (NEXIUM) 40 MG capsule   Oral   Take 40 mg by mouth daily before breakfast.           . fentaNYL (DURAGESIC - DOSED MCG/HR) 75 MCG/HR   Transdermal   Place 1 patch onto the skin every 3 (three) days.         . furosemide (LASIX) 40 MG tablet   Oral   Take 40 mg by mouth every morning.          . gabapentin (NEURONTIN) 600 MG tablet   Oral   Take 900 mg by mouth 3 (three) times daily as needed. Nerve  pain         . HYDROcodone-acetaminophen (NORCO) 5-325 MG per tablet   Oral   Take 1 tablet by mouth every 6 (six) hours as needed. pain          . HYDROcodone-acetaminophen (NORCO) 5-325 MG per tablet   Oral   Take 1 tablet by mouth every 6 (six) hours as needed for pain.   10 tablet   0   . HYDROcodone-acetaminophen (NORCO) 5-325 MG per tablet   Oral   Take 1 tablet by mouth every 6 (six) hours as needed for pain.   20 tablet   0   . HYDROcodone-acetaminophen (NORCO/VICODIN) 5-325 MG per tablet   Oral   Take 2 tablets by mouth every 4 (four) hours as needed for pain.   14 tablet   0   . hydrocortisone 2.5 % cream   Topical   Apply topically 2 (two) times daily as needed. Hemorids          . imipramine (TOFRANIL) 25 MG tablet   Oral   Take 25 mg by mouth at bedtime.           . isosorbide mononitrate (IMDUR) 30 MG 24 hr tablet   Oral   Take 15 mg by mouth every morning.          Marland Kitchen levothyroxine (SYNTHROID, LEVOTHROID) 300 MCG tablet   Oral   Take 300 mcg by mouth daily.           Marland Kitchen lidocaine (LIDODERM) 5 %   Transdermal   Place 1 patch onto the skin daily. Remove & Discard patch within 12 hours or as directed by MD         . losartan-hydrochlorothiazide (HYZAAR) 100-25 MG per tablet   Oral   Take 1 tablet by mouth every morning.          . metFORMIN (GLUCOPHAGE) 500 MG tablet   Oral   Take 500 mg by mouth 2 (two) times daily with a meal.         .  nitroGLYCERIN (NITROSTAT) 0.4 MG SL tablet   Sublingual   Place 0.4 mg under the tongue every 5 (five) minutes x 3 doses as needed. Chest pain         . ondansetron (ZOFRAN) 4 MG tablet   Oral   Take 4 mg by mouth every 8 (eight) hours as needed for nausea.         . Pancrelipase, Lip-Prot-Amyl, (ZENPEP) 5000 UNITS CPEP   Oral   Take 3 capsules by mouth at bedtime.           . potassium chloride (KLOR-CON) 10 MEQ CR tablet   Oral   Take 10 mEq by mouth every morning.          .  promethazine (PHENERGAN) 25 MG tablet   Oral   Take 25 mg by mouth every 6 (six) hours as needed. nausea         . SERTRALINE HCL PO   Oral   Take by mouth at bedtime.          BP 155/97  Pulse 61  Temp(Src) 98.6 F (37 C) (Oral)  Resp 20  Ht 6\' 1"  (1.854 m)  Wt 268 lb (121.564 kg)  BMI 35.37 kg/m2  SpO2 99% Physical Exam  Nursing note and vitals reviewed. Constitutional: He is oriented to person, place, and time. He appears well-developed and well-nourished.  HENT:  Head: Normocephalic and atraumatic.  Eyes: Conjunctivae are normal. Pupils are equal, round, and reactive to light.  Neck: Neck supple. No tracheal deviation present. No thyromegaly present.  Cardiovascular: Normal rate and regular rhythm.   No murmur heard. Pulmonary/Chest: Effort normal and breath sounds normal.  Abdominal: Soft. Bowel sounds are normal. He exhibits no distension. There is no tenderness.  Musculoskeletal: Normal range of motion. He exhibits no edema and no tenderness.  No point tenderness. He has pain at lumbar area when he sits up from a supine position.  Neurological: He is alert and oriented to person, place, and time. He has normal reflexes. Coordination normal.  DTRs symmetric bilaterally at knee jerk ankle jerk biceps toes are bilaterally. Gait normal  Skin: Skin is warm and dry. No rash noted.  Psychiatric: He has a normal mood and affect.    ED Course  Procedures (including critical care time)  DIAGNOSTIC STUDIES: Oxygen Saturation is 99% on RA, normal by my interpretation.    COORDINATION OF CARE: 7:30 PM-Will prescribe pain medication for the weekend, advised pt to make an appointment with PCP. Discussed treatment plan with pt at bedside and pt agreed to plan.   Labs Review Labs Reviewed - No data to display Imaging Review No results found.  EKG Interpretation   None       MDM  No diagnosis found. Plan prescription Norco. He is advised to contact his primary care  physician in 07/29/1999 for pain follow up. Blood pressure recheck 3 weeks Diagnosis #1chronic back pain #2 hypertension  I personally performed the services described in this documentation, which was scribed in my presence. The recorded information has been reviewed and considered.   Doug Sou, MD 07/25/13 1610  Doug Sou, MD 07/25/13 [phone]

## 2013-09-18 ENCOUNTER — Encounter (HOSPITAL_BASED_OUTPATIENT_CLINIC_OR_DEPARTMENT_OTHER): Payer: Self-pay | Admitting: Emergency Medicine

## 2013-09-18 ENCOUNTER — Emergency Department (HOSPITAL_BASED_OUTPATIENT_CLINIC_OR_DEPARTMENT_OTHER)
Admission: EM | Admit: 2013-09-18 | Discharge: 2013-09-18 | Disposition: A | Discharge status: Home / Self Care | Payer: Medicare HMO | Attending: Emergency Medicine | Admitting: Emergency Medicine

## 2013-09-18 ENCOUNTER — Emergency Department (HOSPITAL_BASED_OUTPATIENT_CLINIC_OR_DEPARTMENT_OTHER): Payer: Medicare HMO

## 2013-09-18 DIAGNOSIS — I252 Old myocardial infarction: Secondary | ICD-10-CM | POA: Insufficient documentation

## 2013-09-18 DIAGNOSIS — R1110 Vomiting, unspecified: Secondary | ICD-10-CM | POA: Insufficient documentation

## 2013-09-18 DIAGNOSIS — G8929 Other chronic pain: Secondary | ICD-10-CM | POA: Insufficient documentation

## 2013-09-18 DIAGNOSIS — Z9889 Other specified postprocedural states: Secondary | ICD-10-CM | POA: Insufficient documentation

## 2013-09-18 DIAGNOSIS — R109 Unspecified abdominal pain: Secondary | ICD-10-CM

## 2013-09-18 DIAGNOSIS — K859 Acute pancreatitis without necrosis or infection, unspecified: Secondary | ICD-10-CM | POA: Insufficient documentation

## 2013-09-18 DIAGNOSIS — I10 Essential (primary) hypertension: Secondary | ICD-10-CM | POA: Insufficient documentation

## 2013-09-18 DIAGNOSIS — Z87891 Personal history of nicotine dependence: Secondary | ICD-10-CM | POA: Insufficient documentation

## 2013-09-18 DIAGNOSIS — Z7902 Long term (current) use of antithrombotics/antiplatelets: Secondary | ICD-10-CM | POA: Insufficient documentation

## 2013-09-18 DIAGNOSIS — Z7982 Long term (current) use of aspirin: Secondary | ICD-10-CM | POA: Insufficient documentation

## 2013-09-18 DIAGNOSIS — R1013 Epigastric pain: Secondary | ICD-10-CM | POA: Insufficient documentation

## 2013-09-18 DIAGNOSIS — Z79899 Other long term (current) drug therapy: Secondary | ICD-10-CM | POA: Insufficient documentation

## 2013-09-18 DIAGNOSIS — Z85850 Personal history of malignant neoplasm of thyroid: Secondary | ICD-10-CM | POA: Insufficient documentation

## 2013-09-18 DIAGNOSIS — Z9861 Coronary angioplasty status: Secondary | ICD-10-CM | POA: Insufficient documentation

## 2013-09-18 DIAGNOSIS — Z8673 Personal history of transient ischemic attack (TIA), and cerebral infarction without residual deficits: Secondary | ICD-10-CM | POA: Insufficient documentation

## 2013-09-18 DIAGNOSIS — E119 Type 2 diabetes mellitus without complications: Secondary | ICD-10-CM | POA: Insufficient documentation

## 2013-09-18 LAB — URINE MICROSCOPIC-ADD ON

## 2013-09-18 LAB — CBC WITH DIFFERENTIAL/PLATELET
BASOS ABS: 0.1 10*3/uL (ref 0.0–0.1)
BASOS PCT: 1 % (ref 0–1)
Eosinophils Absolute: 0.3 10*3/uL (ref 0.0–0.7)
Eosinophils Relative: 4 % (ref 0–5)
HCT: 36.3 % — ABNORMAL LOW (ref 39.0–52.0)
Hemoglobin: 11.9 g/dL — ABNORMAL LOW (ref 13.0–17.0)
Lymphocytes Relative: 47 % — ABNORMAL HIGH (ref 12–46)
Lymphs Abs: 3.3 10*3/uL (ref 0.7–4.0)
MCH: 23.1 pg — AB (ref 26.0–34.0)
MCHC: 32.8 g/dL (ref 30.0–36.0)
MCV: 70.5 fL — ABNORMAL LOW (ref 78.0–100.0)
MONOS PCT: 11 % (ref 3–12)
Monocytes Absolute: 0.8 10*3/uL (ref 0.1–1.0)
NEUTROS ABS: 2.6 10*3/uL (ref 1.7–7.7)
NEUTROS PCT: 37 % — AB (ref 43–77)
Platelets: 229 10*3/uL (ref 150–400)
RBC: 5.15 MIL/uL (ref 4.22–5.81)
RDW: 15.8 % — AB (ref 11.5–15.5)
WBC: 7 10*3/uL (ref 4.0–10.5)

## 2013-09-18 LAB — COMPREHENSIVE METABOLIC PANEL
ALBUMIN: 3.5 g/dL (ref 3.5–5.2)
ALK PHOS: 64 U/L (ref 39–117)
ALT: 28 U/L (ref 0–53)
AST: 33 U/L (ref 0–37)
BUN: 11 mg/dL (ref 6–23)
CHLORIDE: 101 meq/L (ref 96–112)
CO2: 27 mEq/L (ref 19–32)
CREATININE: 1.2 mg/dL (ref 0.50–1.35)
Calcium: 8.8 mg/dL (ref 8.4–10.5)
GFR calc Af Amer: 74 mL/min — ABNORMAL LOW (ref >90)
GFR calc non Af Amer: 64 mL/min — ABNORMAL LOW (ref >90)
Glucose, Bld: 95 mg/dL (ref 70–99)
POTASSIUM: 3.9 meq/L (ref 3.7–5.3)
Sodium: 142 mEq/L (ref 137–147)
TOTAL PROTEIN: 7.4 g/dL (ref 6.0–8.3)
Total Bilirubin: 0.2 mg/dL — ABNORMAL LOW (ref 0.3–1.2)

## 2013-09-18 LAB — URINALYSIS, ROUTINE W REFLEX MICROSCOPIC
Bilirubin Urine: NEGATIVE
Glucose, UA: NEGATIVE mg/dL
Ketones, ur: NEGATIVE mg/dL
LEUKOCYTES UA: NEGATIVE
NITRITE: NEGATIVE
PH: 6 (ref 5.0–8.0)
Protein, ur: NEGATIVE mg/dL
SPECIFIC GRAVITY, URINE: 1.02 (ref 1.005–1.030)
UROBILINOGEN UA: 0.2 mg/dL (ref 0.0–1.0)

## 2013-09-18 LAB — TROPONIN I

## 2013-09-18 LAB — LIPASE, BLOOD: LIPASE: 28 U/L (ref 11–59)

## 2013-09-18 MED ORDER — ONDANSETRON HCL 4 MG/2ML IJ SOLN
4.0000 mg | Freq: Once | INTRAMUSCULAR | Status: AC
  Administered 2013-09-18: 4 mg via INTRAVENOUS
  Filled 2013-09-18: qty 2

## 2013-09-18 MED ORDER — ONDANSETRON HCL 4 MG PO TABS: 4.0000 mg | ORAL_TABLET | Freq: Four times a day (QID) | ORAL | Status: AC

## 2013-09-18 MED ORDER — HYDROCODONE-ACETAMINOPHEN 5-325 MG PO TABS: 2.0000 | ORAL_TABLET | ORAL | Status: DC | PRN

## 2013-09-18 MED ORDER — SODIUM CHLORIDE 0.9 % IV BOLUS (SEPSIS)
1000.0000 mL | Freq: Once | INTRAVENOUS | Status: AC
  Administered 2013-09-18: 1000 mL via INTRAVENOUS

## 2013-09-18 MED ORDER — HYDROMORPHONE HCL PF 1 MG/ML IJ SOLN
1.0000 mg | Freq: Once | INTRAMUSCULAR | Status: AC
  Administered 2013-09-18: 1 mg via INTRAVENOUS
  Filled 2013-09-18: qty 1

## 2013-09-18 NOTE — ED Notes (Addendum)
Abdominal pain x 3 days. Hx chronic pancreatitis. His MD gave him pain medication but it is not strong enough per pt.

## 2013-09-18 NOTE — ED Notes (Signed)
Pt given ginger ale per MD orders.  

## 2013-09-18 NOTE — Discharge Instructions (Signed)
Abdominal Pain, Adult °Many things can cause abdominal pain. Usually, abdominal pain is not caused by a disease and will improve without treatment. It can often be observed and treated at home. Your health care provider will do a physical exam and possibly order blood tests and X-rays to help determine the seriousness of your pain. However, in many cases, more time must pass before a clear cause of the pain can be found. Before that point, your health care provider may not know if you need more testing or further treatment. °HOME CARE INSTRUCTIONS  °Monitor your abdominal pain for any changes. The following actions may help to alleviate any discomfort you are experiencing: °· Only take over-the-counter or prescription medicines as directed by your health care provider. °· Do not take laxatives unless directed to do so by your health care provider. °· Try a clear liquid diet (broth, tea, or water) as directed by your health care provider. Slowly move to a bland diet as tolerated. °SEEK MEDICAL CARE IF: °· You have unexplained abdominal pain. °· You have abdominal pain associated with nausea or diarrhea. °· You have pain when you urinate or have a bowel movement. °· You experience abdominal pain that wakes you in the night. °· You have abdominal pain that is worsened or improved by eating food. °· You have abdominal pain that is worsened with eating fatty foods. °SEEK IMMEDIATE MEDICAL CARE IF:  °· Your pain does not go away within 2 hours. °· You have a fever. °· You keep throwing up (vomiting). °· Your pain is felt only in portions of the abdomen, such as the right side or the left lower portion of the abdomen. °· You pass bloody or black tarry stools. °MAKE SURE YOU: °· Understand these instructions.   °· Will watch your condition.   °· Will get help right away if you are not doing well or get worse.   °Document Released: 05/03/2005 Document Revised: 05/14/2013 Document Reviewed: 04/02/2013 °[employer]® Patient  Information ©2014 [employer]. ° °

## 2013-09-18 NOTE — ED Provider Notes (Signed)
CSN: [id number]     Arrival date & time 09/18/13  [id number] History  This chart was scribed for [name], MD by Vernell Barrier, ED scribe. This patient was seen in room MH05/MH05 and the patient's care was started at 9:55 PM.   Chief Complaint  Patient presents with  . Abdominal Pain   The history is provided by the patient. No language interpreter was used.   HPI Comments: Robert Meadows is a 63 y.o. male w/hx of chronic pancreatitis presents to the Emergency Department complaining of constant abdominal pain with emesis. Pain worse after eating. Reports 2 episode of emesis today. State this pain is same as pain in prior episodes related to his pancreatitis. Pt states he usually takes Dilaudid at home but has been out for 1 week. Pt still has a gall bladder and pancreas. Has had a stroke and heart attack in the past. Pt does not drink alcohol. Denies fever, chest pain, loss of bowel/bladder.  Past Medical History  Diagnosis Date  . Hypertension   . Pancreatitis   . Myocardial infarct   . Thyroid cancer   . Diabetes mellitus without complication   . Stroke     multiple in sept and oct 2013  . Chronic back pain    Past Surgical History  Procedure Laterality Date  . Thyroidectomy    . Pancreas surgery    . Knee surgery    . Lumbar epidural injection    . Angioplasty     No family history on file. History  Substance Use Topics  . Smoking status: Former Smoker    Quit date: 06/08/2012  . Smokeless tobacco: Never Used  . Alcohol Use: No    Review of Systems  A complete 10 system review of systems was obtained and all systems are negative except as noted in the HPI and PMH.   Allergies  Ivp dye and Spinach  Home Medications   Current Outpatient Rx  Name  Route  Sig  Dispense  Refill  . ALPRAZOLAM PO   Oral   Take by mouth 3 (three) times daily.         Marland Kitchen amLODipine (NORVASC) 10 MG tablet   Oral   Take 10 mg by mouth every morning.          Marland Kitchen aspirin 325 MG  EC tablet   Oral   Take 81 mg by mouth every morning.          Marland Kitchen atenolol (TENORMIN) 100 MG tablet   Oral   Take 100 mg by mouth every morning.          Marland Kitchen atorvastatin (LIPITOR) 20 MG tablet   Oral   Take 20 mg by mouth at bedtime.           . carbamazepine (TEGRETOL-XR) 100 MG 12 hr tablet   Oral   Take 1 tablet (100 mg total) by mouth 2 (two) times daily.   30 tablet   0   . cloNIDine (CATAPRES) 0.2 MG tablet   Oral   Take 0.2 mg by mouth at bedtime.           . clopidogrel (PLAVIX) 75 MG tablet   Oral   Take 75 mg by mouth every morning.          . Diazepam (VALIUM PO)   Oral   Take by mouth.         . esomeprazole (NEXIUM) 40 MG capsule   Oral   Take  40 mg by mouth daily before breakfast.           . fentaNYL (DURAGESIC - DOSED MCG/HR) 75 MCG/HR   Transdermal   Place 1 patch onto the skin every 3 (three) days.         . furosemide (LASIX) 40 MG tablet   Oral   Take 40 mg by mouth every morning.          . gabapentin (NEURONTIN) 600 MG tablet   Oral   Take 900 mg by mouth 3 (three) times daily as needed. Nerve pain         . HYDROcodone-acetaminophen (NORCO) 5-325 MG per tablet   Oral   Take 1 tablet by mouth every 6 (six) hours as needed. pain          . HYDROcodone-acetaminophen (NORCO) 5-325 MG per tablet   Oral   Take 1 tablet by mouth every 6 (six) hours as needed for pain.   10 tablet   0   . HYDROcodone-acetaminophen (NORCO) 5-325 MG per tablet   Oral   Take 1 tablet by mouth every 6 (six) hours as needed for pain.   20 tablet   0   . HYDROcodone-acetaminophen (NORCO) 5-325 MG per tablet   Oral   Take 1-2 tablets by mouth every 6 (six) hours as needed for severe pain.   12 tablet   0   . HYDROcodone-acetaminophen (NORCO/VICODIN) 5-325 MG per tablet   Oral   Take 2 tablets by mouth every 4 (four) hours as needed for pain.   14 tablet   0   . HYDROcodone-acetaminophen (NORCO/VICODIN) 5-325 MG per tablet   Oral    Take 2 tablets by mouth every 4 (four) hours as needed.   10 tablet   0   . hydrocortisone 2.5 % cream   Topical   Apply topically 2 (two) times daily as needed. Hemorids          . imipramine (TOFRANIL) 25 MG tablet   Oral   Take 25 mg by mouth at bedtime.           . isosorbide mononitrate (IMDUR) 30 MG 24 hr tablet   Oral   Take 15 mg by mouth every morning.          Marland Kitchen levothyroxine (SYNTHROID, LEVOTHROID) 300 MCG tablet   Oral   Take 300 mcg by mouth daily.           Marland Kitchen lidocaine (LIDODERM) 5 %   Transdermal   Place 1 patch onto the skin daily. Remove & Discard patch within 12 hours or as directed by MD         . losartan-hydrochlorothiazide (HYZAAR) 100-25 MG per tablet   Oral   Take 1 tablet by mouth every morning.          . metFORMIN (GLUCOPHAGE) 500 MG tablet   Oral   Take 500 mg by mouth 2 (two) times daily with a meal.         . nitroGLYCERIN (NITROSTAT) 0.4 MG SL tablet   Sublingual   Place 0.4 mg under the tongue every 5 (five) minutes x 3 doses as needed. Chest pain         . ondansetron (ZOFRAN) 4 MG tablet   Oral   Take 4 mg by mouth every 8 (eight) hours as needed for nausea.         . ondansetron (ZOFRAN) 4 MG tablet   Oral  Take 1 tablet (4 mg total) by mouth every 6 (six) hours.   12 tablet   0   . Pancrelipase, Lip-Prot-Amyl, (ZENPEP) 5000 UNITS CPEP   Oral   Take 3 capsules by mouth at bedtime.           . potassium chloride (KLOR-CON) 10 MEQ CR tablet   Oral   Take 10 mEq by mouth every morning.          . promethazine (PHENERGAN) 25 MG tablet   Oral   Take 25 mg by mouth every 6 (six) hours as needed. nausea         . SERTRALINE HCL PO   Oral   Take by mouth at bedtime.          Triage Vitals: BP 150/90  Pulse 67  Temp(Src) 98.5 F (36.9 C) (Oral)  Resp 18  Ht 6\' 1"  (1.854 m)  Wt 268 lb (121.564 kg)  BMI 35.37 kg/m2  SpO2 100%  Physical Exam  Nursing note and vitals reviewed. Constitutional:  He is oriented to person, place, and time. He appears well-developed and well-nourished.  HENT:  Head: Normocephalic and atraumatic.  Eyes: Conjunctivae and EOM are normal.  Neck: Normal range of motion.  Cardiovascular: Normal rate and normal heart sounds.   Pulmonary/Chest: Effort normal. No respiratory distress.  Abdominal: Soft. There is no rebound and no guarding.  Epigastric tenderness. No RUQ tenderness. Lower abdomen soft. No guarding or rebound throughout.  Musculoskeletal: Normal range of motion. He exhibits no tenderness.  5/5 strength in bilateral lower extremities. Ankle plantar and dorsiflexion intact. Great toe extension intact bilaterally. +2 DP and PT pulses. +2 patellar reflexes bilaterally. Normal gait.  Neurological: He is oriented to person, place, and time. He has normal reflexes.  Skin: Skin is warm and dry.  Psychiatric: He has a normal mood and affect. His behavior is normal.   ED Course  Procedures  DIAGNOSTIC STUDIES: Oxygen Saturation is 100% on room air, normal by my interpretation.    COORDINATION OF CARE: At .9:58 PM: Discussed treatment plan with patient which includes pain medication. Patient agrees.   Labs Review Labs Reviewed  URINALYSIS, ROUTINE W REFLEX MICROSCOPIC - Abnormal; Notable for the following:    Hgb urine dipstick LARGE (*)    All other components within normal limits  CBC WITH DIFFERENTIAL - Abnormal; Notable for the following:    Hemoglobin 11.9 (*)    HCT 36.3 (*)    MCV 70.5 (*)    MCH 23.1 (*)    RDW 15.8 (*)    Neutrophils Relative % 37 (*)    Lymphocytes Relative 47 (*)    All other components within normal limits  COMPREHENSIVE METABOLIC PANEL - Abnormal; Notable for the following:    Total Bilirubin 0.2 (*)    GFR calc non Af Amer 64 (*)    GFR calc Af Amer 74 (*)    All other components within normal limits  URINE MICROSCOPIC-ADD ON  LIPASE, BLOOD  TROPONIN I   Imaging Review Ct Abdomen Pelvis Wo  Contrast  09/18/2013   CLINICAL DATA:  Abdomen pain, nausea, vomiting  EXAM: CT ABDOMEN AND PELVIS WITHOUT CONTRAST  TECHNIQUE: Multidetector CT imaging of the abdomen and pelvis was performed following the standard protocol without intravenous contrast.  COMPARISON:  None.  FINDINGS: There is mild diffuse fatty infiltration of liver. The spleen, pancreas, adrenal glands and kidneys are normal. There is no hydronephrosis bilaterally. The gallbladder is decompressed. There  is atherosclerosis of the abdominal aorta without aneurysmal dilatation. There is no abdominal lymphadenopathy. There is no small bowel obstruction or diverticulitis. The appendix is normal.  Fluid-filled bladder is normal. Pelvic phleboliths are identified. Degenerative joint changes of the spine are noted. The visualized lung bases are clear.  IMPRESSION: No acute abnormality identified in the abdomen and pelvis.   Electronically Signed   By: Abelardo Diesel M.D.   On: 09/18/2013 22:36    EKG Interpretation   None       MDM   Final diagnoses:  Chronic abdominal pain    history of chronic pancreatitis presenting with typical epigastric pain for the past 3 days. States he is out of his Dilaudid normally takes for this pain. Denies any fevers. 2 episodes of emesis today. No diarrhea or urinary symptoms. No chest pain or shortness of breath.  Epigastric tenderness. No peritoneal signs. Labs appear to be at baseline. No LFTs or lipase elevation. No leukocytosis.  Patient tolerating by mouth. Pain control in ED. He requests Dilaudid for home use. This was declined. He'll be given a short course of pain medication and instructed to the followup with his PCP.   I personally performed the services described in this documentation, which was scribed in my presence. The recorded information has been reviewed and is accurate.      [name], MD 09/19/13 0010

## 2013-09-19 LAB — CG4 I-STAT (LACTIC ACID): Lactic Acid, Venous: 1.03 mmol/L (ref 0.5–2.2)

## 2013-11-28 ENCOUNTER — Encounter (HOSPITAL_BASED_OUTPATIENT_CLINIC_OR_DEPARTMENT_OTHER): Payer: Self-pay | Admitting: Emergency Medicine

## 2013-11-28 ENCOUNTER — Emergency Department (HOSPITAL_BASED_OUTPATIENT_CLINIC_OR_DEPARTMENT_OTHER)
Admission: EM | Admit: 2013-11-28 | Discharge: 2013-11-28 | Disposition: A | Discharge status: Home / Self Care | Payer: Medicare HMO | Attending: Emergency Medicine | Admitting: Emergency Medicine

## 2013-11-28 DIAGNOSIS — I252 Old myocardial infarction: Secondary | ICD-10-CM | POA: Insufficient documentation

## 2013-11-28 DIAGNOSIS — Z8673 Personal history of transient ischemic attack (TIA), and cerebral infarction without residual deficits: Secondary | ICD-10-CM | POA: Insufficient documentation

## 2013-11-28 DIAGNOSIS — M545 Low back pain, unspecified: Secondary | ICD-10-CM | POA: Insufficient documentation

## 2013-11-28 DIAGNOSIS — Z7982 Long term (current) use of aspirin: Secondary | ICD-10-CM | POA: Insufficient documentation

## 2013-11-28 DIAGNOSIS — IMO0002 Reserved for concepts with insufficient information to code with codable children: Secondary | ICD-10-CM | POA: Insufficient documentation

## 2013-11-28 DIAGNOSIS — Z7902 Long term (current) use of antithrombotics/antiplatelets: Secondary | ICD-10-CM | POA: Insufficient documentation

## 2013-11-28 DIAGNOSIS — Z79899 Other long term (current) drug therapy: Secondary | ICD-10-CM | POA: Insufficient documentation

## 2013-11-28 DIAGNOSIS — Z87891 Personal history of nicotine dependence: Secondary | ICD-10-CM | POA: Insufficient documentation

## 2013-11-28 DIAGNOSIS — G8929 Other chronic pain: Secondary | ICD-10-CM | POA: Insufficient documentation

## 2013-11-28 DIAGNOSIS — E119 Type 2 diabetes mellitus without complications: Secondary | ICD-10-CM | POA: Insufficient documentation

## 2013-11-28 DIAGNOSIS — M549 Dorsalgia, unspecified: Secondary | ICD-10-CM

## 2013-11-28 DIAGNOSIS — Z85850 Personal history of malignant neoplasm of thyroid: Secondary | ICD-10-CM | POA: Insufficient documentation

## 2013-11-28 DIAGNOSIS — K859 Acute pancreatitis without necrosis or infection, unspecified: Secondary | ICD-10-CM | POA: Insufficient documentation

## 2013-11-28 DIAGNOSIS — I10 Essential (primary) hypertension: Secondary | ICD-10-CM | POA: Insufficient documentation

## 2013-11-28 MED ORDER — BACLOFEN 10 MG PO TABS: 10.0000 mg | ORAL_TABLET | Freq: Three times a day (TID) | ORAL | Status: AC

## 2013-11-28 MED ORDER — HYDROMORPHONE HCL PF 1 MG/ML IJ SOLN
1.0000 mg | Freq: Once | INTRAMUSCULAR | Status: AC
  Administered 2013-11-28: 1 mg via INTRAMUSCULAR
  Filled 2013-11-28: qty 1

## 2013-11-28 MED ORDER — ONDANSETRON 4 MG PO TBDP
4.0000 mg | ORAL_TABLET | Freq: Once | ORAL | Status: AC
  Administered 2013-11-28: 4 mg via ORAL
  Filled 2013-11-28: qty 1

## 2013-11-28 NOTE — ED Provider Notes (Signed)
CSN: [id number]     Arrival date & time 11/28/13  1700 History   First MD Initiated Contact with Patient 11/28/13 1802     Chief Complaint  Patient presents with  . Back Pain     (Consider location/radiation/quality/duration/timing/severity/associated sxs/prior Treatment) Patient is a 63 y.o. male presenting with back pain. The history is provided by the patient.  Back Pain Location:  Lumbar spine Quality:  Aching and burning Radiates to:  L knee Pain severity:  Severe Pain is:  Same all the time Onset quality:  Gradual Timing:  Constant Progression:  Worsening Chronicity:  Chronic Context: not falling, not lifting heavy objects, not recent injury and not twisting   Relieved by:  Nothing Worsened by:  Bending, standing and twisting Ineffective treatments:  Narcotics and OTC medications Associated symptoms: leg pain   Associated symptoms: no abdominal pain, no abdominal swelling, no bladder incontinence, no bowel incontinence, no chest pain, no dysuria, no fever, no headaches, no numbness, no paresthesias, no pelvic pain, no perianal numbness, no tingling and no weakness     Past Medical History  Diagnosis Date  . Hypertension   . Pancreatitis   . Myocardial infarct   . Thyroid cancer   . Diabetes mellitus without complication   . Stroke     multiple in sept and oct 2013  . Chronic back pain    Past Surgical History  Procedure Laterality Date  . Thyroidectomy    . Pancreas surgery    . Knee surgery    . Lumbar epidural injection    . Angioplasty     No family history on file. History  Substance Use Topics  . Smoking status: Former Smoker    Quit date: 06/08/2012  . Smokeless tobacco: Never Used  . Alcohol Use: No    Review of Systems  Constitutional: Negative for fever.  Respiratory: Negative for shortness of breath.   Cardiovascular: Negative for chest pain.  Gastrointestinal: Negative for vomiting, abdominal pain, constipation and bowel incontinence.   Genitourinary: Negative for bladder incontinence, dysuria, hematuria, flank pain, decreased urine volume, difficulty urinating and pelvic pain.       No perineal numbness or incontinence of urine or feces  Musculoskeletal: Positive for back pain. Negative for joint swelling.  Skin: Negative for rash.  Neurological: Negative for tingling, weakness, numbness, headaches and paresthesias.  All other systems reviewed and are negative.     Allergies  Ivp dye and Spinach  Home Medications   Prior to Admission medications   Medication Sig Start Date End Date Taking? Authorizing Provider  ALPRAZOLAM PO Take by mouth 3 (three) times daily.    Historical Provider, MD  amLODipine (NORVASC) 10 MG tablet Take 10 mg by mouth every morning.     Historical Provider, MD  aspirin 325 MG EC tablet Take 81 mg by mouth every morning.     Historical Provider, MD  atenolol (TENORMIN) 100 MG tablet Take 100 mg by mouth every morning.     Historical Provider, MD  atorvastatin (LIPITOR) 20 MG tablet Take 20 mg by mouth at bedtime.      Historical Provider, MD  carbamazepine (TEGRETOL-XR) 100 MG 12 hr tablet Take 1 tablet (100 mg total) by mouth 2 (two) times daily. 05/18/13   Orpah Greek, MD  cloNIDine (CATAPRES) 0.2 MG tablet Take 0.2 mg by mouth at bedtime.      Historical Provider, MD  clopidogrel (PLAVIX) 75 MG tablet Take 75 mg by mouth every morning.  Historical Provider, MD  Diazepam (VALIUM PO) Take by mouth.    Historical Provider, MD  esomeprazole (NEXIUM) 40 MG capsule Take 40 mg by mouth daily before breakfast.      Historical Provider, MD  fentaNYL (DURAGESIC - DOSED MCG/HR) 75 MCG/HR Place 1 patch onto the skin every 3 (three) days.    Historical Provider, MD  furosemide (LASIX) 40 MG tablet Take 40 mg by mouth every morning.     Historical Provider, MD  gabapentin (NEURONTIN) 600 MG tablet Take 900 mg by mouth 3 (three) times daily as needed. Nerve pain    Historical Provider, MD   HYDROcodone-acetaminophen (NORCO) 5-325 MG per tablet Take 1 tablet by mouth every 6 (six) hours as needed. pain     Historical Provider, MD  HYDROcodone-acetaminophen (NORCO) 5-325 MG per tablet Take 1 tablet by mouth every 6 (six) hours as needed for pain. 08/12/12   Babette Relic, MD  HYDROcodone-acetaminophen (NORCO) 5-325 MG per tablet Take 1 tablet by mouth every 6 (six) hours as needed for pain. 02/02/13   Tatyana A Kirichenko, PA-C  HYDROcodone-acetaminophen (NORCO) 5-325 MG per tablet Take 1-2 tablets by mouth every 6 (six) hours as needed for severe pain. 07/25/13   Orlie Dakin, MD  HYDROcodone-acetaminophen (NORCO/VICODIN) 5-325 MG per tablet Take 2 tablets by mouth every 4 (four) hours as needed for pain. 05/18/13   Orpah Greek, MD  HYDROcodone-acetaminophen (NORCO/VICODIN) 5-325 MG per tablet Take 2 tablets by mouth every 4 (four) hours as needed. 09/18/13   [name], MD  hydrocortisone 2.5 % cream Apply topically 2 (two) times daily as needed. Hemorids     Historical Provider, MD  imipramine (TOFRANIL) 25 MG tablet Take 25 mg by mouth at bedtime.      Historical Provider, MD  isosorbide mononitrate (IMDUR) 30 MG 24 hr tablet Take 15 mg by mouth every morning.     Historical Provider, MD  levothyroxine (SYNTHROID, LEVOTHROID) 300 MCG tablet Take 300 mcg by mouth daily.      Historical Provider, MD  lidocaine (LIDODERM) 5 % Place 1 patch onto the skin daily. Remove & Discard patch within 12 hours or as directed by MD    Historical Provider, MD  losartan-hydrochlorothiazide (HYZAAR) 100-25 MG per tablet Take 1 tablet by mouth every morning.     Historical Provider, MD  metFORMIN (GLUCOPHAGE) 500 MG tablet Take 500 mg by mouth 2 (two) times daily with a meal.    Historical Provider, MD  nitroGLYCERIN (NITROSTAT) 0.4 MG SL tablet Place 0.4 mg under the tongue every 5 (five) minutes x 3 doses as needed. Chest pain    Historical Provider, MD  ondansetron (ZOFRAN) 4 MG tablet  Take 4 mg by mouth every 8 (eight) hours as needed for nausea.    Historical Provider, MD  ondansetron (ZOFRAN) 4 MG tablet Take 1 tablet (4 mg total) by mouth every 6 (six) hours. 09/18/13   [name], MD  Pancrelipase, Lip-Prot-Amyl, (ZENPEP) 5000 UNITS CPEP Take 3 capsules by mouth at bedtime.      Historical Provider, MD  potassium chloride (KLOR-CON) 10 MEQ CR tablet Take 10 mEq by mouth every morning.     Historical Provider, MD  promethazine (PHENERGAN) 25 MG tablet Take 25 mg by mouth every 6 (six) hours as needed. nausea    Historical Provider, MD  SERTRALINE HCL PO Take by mouth at bedtime.    Historical Provider, MD   BP 142/82  Pulse 66  Temp(Src) 98.2  F (36.8 C) (Oral)  Resp 18  Ht 6\' 1"  (1.854 m)  Wt 268 lb (121.564 kg)  BMI 35.37 kg/m2  SpO2 99% Physical Exam  Nursing note and vitals reviewed. Constitutional: He is oriented to person, place, and time. He appears well-developed and well-nourished. No distress.  HENT:  Head: Normocephalic and atraumatic.  Neck: Normal range of motion. Neck supple.  Cardiovascular: Normal rate, regular rhythm, normal heart sounds and intact distal pulses.   No murmur heard. Pulmonary/Chest: Effort normal and breath sounds normal. No respiratory distress.  Abdominal: Soft. He exhibits no distension. There is no tenderness.  Musculoskeletal: He exhibits tenderness. He exhibits no edema.       Lumbar back: He exhibits tenderness and pain. He exhibits normal range of motion, no swelling, no deformity, no laceration and normal pulse.  ttp of the left lumbar spine,paraspinal muscles and SI joint.  DP pulses are brisk and symmetrical.  Distal sensation intact.  Hip Flexors/Extensors are intact.  Pt has normal strength against resistance of bilateral lower extremities.     Neurological: He is alert and oriented to person, place, and time. He has normal strength. No sensory deficit. He exhibits normal muscle tone. Coordination and gait normal.   Reflex Scores:      Patellar reflexes are 2+ on the right side and 2+ on the left side.      Achilles reflexes are 2+ on the right side and 2+ on the left side. Skin: Skin is warm and dry. No rash noted.    ED Course  Procedures (including critical care time) Labs Review Labs Reviewed - No data to display  Imaging Review No results found.   EKG Interpretation None      MDM   Final diagnoses:  Chronic back pain   Pt is well appearing, ambulates with a steady gait.  Hx of acute on chronic low pain back.  No concerning sx's for emergent neurological or infectious process.  Pt requesting prescription for "something for the pain".  Imaging not indicated at this time due to hx of chronic pain and non emergent exam findings   Patient reviewed on the [place] narcotic database.  Received #40 hydromorphone 2 mg tabs on 11/21/13 and #10 fentanyl patches on same day  I has reviewed this with the patient and advised him that I will administer mediation during his visit, but that further narcotic medications will not be prescribed and that he will need to f/u with his pain management physician.   rx for baclofen written instead.  Pt appears stable for d/c and agrees to plan and verbalized understanding.       Yarnell Kozloski L. Vanessa [place], PA-C 11/29/13 2123

## 2013-11-28 NOTE — ED Notes (Signed)
Back pain. Chronic pain but worse today.

## 2013-11-28 NOTE — Discharge Instructions (Signed)
Chronic Back Pain   When back pain lasts longer than 3 months, it is called chronic back pain.People with chronic back pain often go through certain periods that are more intense (flare-ups).   CAUSES  Chronic back pain can be caused by wear and tear (degeneration) on different structures in your back. These structures include:   The bones of your spine (vertebrae) and the joints surrounding your spinal cord and nerve roots (facets).   The strong, fibrous tissues that connect your vertebrae (ligaments).  Degeneration of these structures may result in pressure on your nerves. This can lead to constant pain.  HOME CARE INSTRUCTIONS   Avoid bending, heavy lifting, prolonged sitting, and activities which make the problem worse.   Take brief periods of rest throughout the day to reduce your pain. Lying down or standing usually is better than sitting while you are resting.   Take over-the-counter or prescription medicines only as directed by your caregiver.  SEEK IMMEDIATE MEDICAL CARE IF:    You have weakness or numbness in one of your legs or feet.   You have trouble controlling your bladder or bowels.   You have nausea, vomiting, abdominal pain, shortness of breath, or fainting.  Document Released: 08/31/2004 Document Revised: 10/16/2011 Document Reviewed: 07/08/2011  [employer] Patient Information 2014 [employer]

## 2013-11-28 NOTE — ED Notes (Signed)
Here for exacerbation of his chronic back pain x 3 months, has "herniated disk", denies taking any pain medication at home.  Pt doesn't appear to be in pain. Sitting comfortably in chair.

## 2013-11-29 NOTE — ED Provider Notes (Signed)
Medical screening examination/treatment/procedure(s) were performed by non-physician practitioner and as supervising physician I was immediately available for consultation/collaboration.   EKG Interpretation None        Charles B. Sheldon, MD 11/29/13 2340 

## 2014-03-29 ENCOUNTER — Emergency Department (HOSPITAL_BASED_OUTPATIENT_CLINIC_OR_DEPARTMENT_OTHER)
Admission: EM | Admit: 2014-03-29 | Discharge: 2014-03-29 | Disposition: A | Discharge status: Home / Self Care | Payer: Medicare HMO | Attending: Emergency Medicine | Admitting: Emergency Medicine

## 2014-03-29 ENCOUNTER — Encounter (HOSPITAL_BASED_OUTPATIENT_CLINIC_OR_DEPARTMENT_OTHER): Payer: Self-pay | Admitting: Emergency Medicine

## 2014-03-29 DIAGNOSIS — E119 Type 2 diabetes mellitus without complications: Secondary | ICD-10-CM | POA: Diagnosis not present

## 2014-03-29 DIAGNOSIS — G8929 Other chronic pain: Secondary | ICD-10-CM | POA: Insufficient documentation

## 2014-03-29 DIAGNOSIS — Z8719 Personal history of other diseases of the digestive system: Secondary | ICD-10-CM | POA: Diagnosis not present

## 2014-03-29 DIAGNOSIS — Z85850 Personal history of malignant neoplasm of thyroid: Secondary | ICD-10-CM | POA: Insufficient documentation

## 2014-03-29 DIAGNOSIS — Z9861 Coronary angioplasty status: Secondary | ICD-10-CM | POA: Diagnosis not present

## 2014-03-29 DIAGNOSIS — R112 Nausea with vomiting, unspecified: Secondary | ICD-10-CM | POA: Insufficient documentation

## 2014-03-29 DIAGNOSIS — R1032 Left lower quadrant pain: Secondary | ICD-10-CM | POA: Diagnosis not present

## 2014-03-29 DIAGNOSIS — Z7902 Long term (current) use of antithrombotics/antiplatelets: Secondary | ICD-10-CM | POA: Diagnosis not present

## 2014-03-29 DIAGNOSIS — Z7982 Long term (current) use of aspirin: Secondary | ICD-10-CM | POA: Diagnosis not present

## 2014-03-29 DIAGNOSIS — Z79899 Other long term (current) drug therapy: Secondary | ICD-10-CM | POA: Diagnosis not present

## 2014-03-29 DIAGNOSIS — Z8673 Personal history of transient ischemic attack (TIA), and cerebral infarction without residual deficits: Secondary | ICD-10-CM | POA: Diagnosis not present

## 2014-03-29 DIAGNOSIS — I10 Essential (primary) hypertension: Secondary | ICD-10-CM | POA: Insufficient documentation

## 2014-03-29 DIAGNOSIS — I252 Old myocardial infarction: Secondary | ICD-10-CM | POA: Insufficient documentation

## 2014-03-29 DIAGNOSIS — R109 Unspecified abdominal pain: Secondary | ICD-10-CM

## 2014-03-29 MED ORDER — SODIUM CHLORIDE 0.9 % IV SOLN
Freq: Once | INTRAVENOUS | Status: AC
  Administered 2014-03-29: 13:00:00 via INTRAVENOUS

## 2014-03-29 MED ORDER — PROMETHAZINE HCL 25 MG/ML IJ SOLN
12.5000 mg | Freq: Once | INTRAMUSCULAR | Status: AC
  Administered 2014-03-29: 12.5 mg via INTRAVENOUS
  Filled 2014-03-29: qty 1

## 2014-03-29 MED ORDER — HYDROMORPHONE HCL PF 1 MG/ML IJ SOLN
0.5000 mg | Freq: Once | INTRAMUSCULAR | Status: AC
  Administered 2014-03-29: 0.5 mg via INTRAVENOUS
  Filled 2014-03-29: qty 1

## 2014-03-29 MED ORDER — HYDROCODONE-ACETAMINOPHEN 5-325 MG PO TABS: 1.0000 | ORAL_TABLET | ORAL | Status: DC | PRN

## 2014-03-29 MED ORDER — HYDROMORPHONE HCL PF 1 MG/ML IJ SOLN
1.0000 mg | Freq: Once | INTRAMUSCULAR | Status: AC
  Administered 2014-03-29: 1 mg via INTRAVENOUS
  Filled 2014-03-29: qty 1

## 2014-03-29 MED ORDER — PROMETHAZINE HCL 25 MG PO TABS: 25.0000 mg | ORAL_TABLET | Freq: Four times a day (QID) | ORAL | Status: DC | PRN

## 2014-03-29 NOTE — Discharge Instructions (Signed)

## 2014-03-29 NOTE — ED Notes (Signed)
Patient states he is experiencing  abd pain and vomiting since this morning, Hx of pancreatitis

## 2014-03-29 NOTE — ED Provider Notes (Signed)
CSN: [id number]     Arrival date & time 03/29/14  1043 History   First MD Initiated Contact with Patient 03/29/14 1205     Chief Complaint  Patient presents with  . Emesis     (Consider location/radiation/quality/duration/timing/severity/associated sxs/prior Treatment) Patient is a 63 y.o. male presenting with vomiting. The history is provided by the patient and the spouse. No language interpreter was used.  Emesis Severity:  Moderate Duration:  4 months Progression:  Unchanged Chronicity:  Chronic Recent urination:  Normal Associated symptoms: abdominal pain   Associated symptoms: no chills and no diarrhea   Associated symptoms comment:  He is here for symptomatic treatment of abdominal pain and vomiting he associates with his chronic pancreatitis. He denies any new symptoms. He reports pain and vomiting everyday for the past several months. No fever. His pain is no different today than previously. He denies hematemesis. No melena. He reports a recent upper endoscopy that showed only a hiatal hernia and a colonoscopy that was also negative.    Past Medical History  Diagnosis Date  . Hypertension   . Pancreatitis   . Myocardial infarct   . Diabetes mellitus without complication   . Chronic back pain   . Thyroid cancer   . Stroke     multiple in sept and oct 2013   Past Surgical History  Procedure Laterality Date  . Thyroidectomy    . Pancreas surgery    . Knee surgery    . Lumbar epidural injection    . Angioplasty     No family history on file. History  Substance Use Topics  . Smoking status: Former Smoker    Quit date: 06/08/2012  . Smokeless tobacco: Never Used  . Alcohol Use: No    Review of Systems  Constitutional: Negative for fever and chills.  Respiratory: Negative.  Negative for shortness of breath.   Cardiovascular: Negative.  Negative for chest pain.  Gastrointestinal: Positive for nausea, vomiting and abdominal pain. Negative for diarrhea and blood in  stool.  Genitourinary: Negative.  Negative for decreased urine volume.  Musculoskeletal: Negative.  Negative for back pain.  Skin: Negative.   Neurological: Negative.  Negative for dizziness and weakness.  Psychiatric/Behavioral: Negative.  Negative for confusion.      Allergies  Ivp dye and Spinach  Home Medications   Prior to Admission medications   Medication Sig Start Date End Date Taking? Authorizing Provider  ALPRAZOLAM PO Take by mouth 3 (three) times daily.    Historical Provider, MD  amLODipine (NORVASC) 10 MG tablet Take 10 mg by mouth every morning.     Historical Provider, MD  aspirin 325 MG EC tablet Take 81 mg by mouth every morning.     Historical Provider, MD  atenolol (TENORMIN) 100 MG tablet Take 100 mg by mouth every morning.     Historical Provider, MD  atorvastatin (LIPITOR) 20 MG tablet Take 20 mg by mouth at bedtime.      Historical Provider, MD  baclofen (LIORESAL) 10 MG tablet Take 1 tablet (10 mg total) by mouth 3 (three) times daily. 11/28/13   Tammy L. Triplett, PA-C  carbamazepine (TEGRETOL-XR) 100 MG 12 hr tablet Take 1 tablet (100 mg total) by mouth 2 (two) times daily. 05/18/13   Orpah Greek, MD  cloNIDine (CATAPRES) 0.2 MG tablet Take 0.2 mg by mouth at bedtime.      Historical Provider, MD  clopidogrel (PLAVIX) 75 MG tablet Take 75 mg by mouth every morning.  Historical Provider, MD  Diazepam (VALIUM PO) Take by mouth.    Historical Provider, MD  esomeprazole (NEXIUM) 40 MG capsule Take 40 mg by mouth daily before breakfast.      Historical Provider, MD  fentaNYL (DURAGESIC - DOSED MCG/HR) 75 MCG/HR Place 1 patch onto the skin every 3 (three) days.    Historical Provider, MD  furosemide (LASIX) 40 MG tablet Take 40 mg by mouth every morning.     Historical Provider, MD  gabapentin (NEURONTIN) 600 MG tablet Take 900 mg by mouth 3 (three) times daily as needed. Nerve pain    Historical Provider, MD  HYDROcodone-acetaminophen (NORCO) 5-325 MG  per tablet Take 1 tablet by mouth every 6 (six) hours as needed. pain     Historical Provider, MD  HYDROcodone-acetaminophen (NORCO) 5-325 MG per tablet Take 1 tablet by mouth every 6 (six) hours as needed for pain. 08/12/12   Babette Relic, MD  HYDROcodone-acetaminophen (NORCO) 5-325 MG per tablet Take 1 tablet by mouth every 6 (six) hours as needed for pain. 02/02/13   Tatyana A Kirichenko, PA-C  HYDROcodone-acetaminophen (NORCO) 5-325 MG per tablet Take 1-2 tablets by mouth every 6 (six) hours as needed for severe pain. 07/25/13   Orlie Dakin, MD  HYDROcodone-acetaminophen (NORCO/VICODIN) 5-325 MG per tablet Take 2 tablets by mouth every 4 (four) hours as needed for pain. 05/18/13   Orpah Greek, MD  HYDROcodone-acetaminophen (NORCO/VICODIN) 5-325 MG per tablet Take 2 tablets by mouth every 4 (four) hours as needed. 09/18/13   [name], MD  hydrocortisone 2.5 % cream Apply topically 2 (two) times daily as needed. Hemorids     Historical Provider, MD  imipramine (TOFRANIL) 25 MG tablet Take 25 mg by mouth at bedtime.      Historical Provider, MD  isosorbide mononitrate (IMDUR) 30 MG 24 hr tablet Take 15 mg by mouth every morning.     Historical Provider, MD  levothyroxine (SYNTHROID, LEVOTHROID) 300 MCG tablet Take 300 mcg by mouth daily.      Historical Provider, MD  lidocaine (LIDODERM) 5 % Place 1 patch onto the skin daily. Remove & Discard patch within 12 hours or as directed by MD    Historical Provider, MD  losartan-hydrochlorothiazide (HYZAAR) 100-25 MG per tablet Take 1 tablet by mouth every morning.     Historical Provider, MD  metFORMIN (GLUCOPHAGE) 500 MG tablet Take 500 mg by mouth 2 (two) times daily with a meal.    Historical Provider, MD  nitroGLYCERIN (NITROSTAT) 0.4 MG SL tablet Place 0.4 mg under the tongue every 5 (five) minutes x 3 doses as needed. Chest pain    Historical Provider, MD  ondansetron (ZOFRAN) 4 MG tablet Take 4 mg by mouth every 8 (eight) hours as  needed for nausea.    Historical Provider, MD  ondansetron (ZOFRAN) 4 MG tablet Take 1 tablet (4 mg total) by mouth every 6 (six) hours. 09/18/13   [name], MD  Pancrelipase, Lip-Prot-Amyl, (ZENPEP) 5000 UNITS CPEP Take 3 capsules by mouth at bedtime.      Historical Provider, MD  potassium chloride (KLOR-CON) 10 MEQ CR tablet Take 10 mEq by mouth every morning.     Historical Provider, MD  promethazine (PHENERGAN) 25 MG tablet Take 25 mg by mouth every 6 (six) hours as needed. nausea    Historical Provider, MD  SERTRALINE HCL PO Take by mouth at bedtime.    Historical Provider, MD   BP 130/86  Pulse 67  Temp(Src) 98.8  F (37.1 C) (Oral)  Resp 18  Ht 6\' 1"  (1.854 m)  Wt 240 lb (108.863 kg)  BMI 31.67 kg/m2  SpO2 100% Physical Exam  Constitutional: He is oriented to person, place, and time. He appears well-developed and well-nourished. No distress.  HENT:  Head: Normocephalic.  Eyes: Conjunctivae are normal.  Neck: Normal range of motion. Neck supple.  Cardiovascular: Normal rate.   No murmur heard. Pulmonary/Chest: Effort normal. He has no wheezes. He has no rales. He exhibits no tenderness.  Abdominal: Soft. He exhibits no mass. There is no rebound and no guarding.  LUQ and epigastric tenderness to palpation. Soft abdomen.   Musculoskeletal: Normal range of motion.  Neurological: He is alert and oriented to person, place, and time. Coordination normal.  Skin: Skin is warm and dry.  Psychiatric: He has a normal mood and affect.    ED Course  Procedures (including critical care time) Labs Review Labs Reviewed - No data to display  Imaging Review No results found.   EKG Interpretation None      MDM   Final diagnoses:  None    1. Chronic abdominal pain  No new symptoms with unchanged symptoms of months duration. VSS completley normal. No lab studies thought helpful. He is feeling better with medications. Abdomen remains soft. Tolerating PO fluids without  further vomiting. Discussed with Dr. Regenia Skeeter. Stable for discharge.     Dewaine Oats, PA-C 03/29/14 1419

## 2014-03-31 NOTE — ED Provider Notes (Signed)
Medical screening examination/treatment/procedure(s) were performed by non-physician practitioner and as supervising physician I was immediately available for consultation/collaboration.   EKG Interpretation None        Ephraim Hamburger, MD 03/31/14 [phone]

## 2014-04-14 ENCOUNTER — Emergency Department (HOSPITAL_BASED_OUTPATIENT_CLINIC_OR_DEPARTMENT_OTHER)
Admission: EM | Admit: 2014-04-14 | Discharge: 2014-04-14 | Disposition: A | Discharge status: Home / Self Care | Payer: Medicare HMO | Attending: Emergency Medicine | Admitting: Emergency Medicine

## 2014-04-14 ENCOUNTER — Encounter (HOSPITAL_BASED_OUTPATIENT_CLINIC_OR_DEPARTMENT_OTHER): Payer: Self-pay | Admitting: Emergency Medicine

## 2014-04-14 DIAGNOSIS — Z791 Long term (current) use of non-steroidal anti-inflammatories (NSAID): Secondary | ICD-10-CM | POA: Diagnosis not present

## 2014-04-14 DIAGNOSIS — I10 Essential (primary) hypertension: Secondary | ICD-10-CM | POA: Insufficient documentation

## 2014-04-14 DIAGNOSIS — R112 Nausea with vomiting, unspecified: Secondary | ICD-10-CM | POA: Diagnosis present

## 2014-04-14 DIAGNOSIS — Z8719 Personal history of other diseases of the digestive system: Secondary | ICD-10-CM | POA: Diagnosis not present

## 2014-04-14 DIAGNOSIS — Z8673 Personal history of transient ischemic attack (TIA), and cerebral infarction without residual deficits: Secondary | ICD-10-CM | POA: Insufficient documentation

## 2014-04-14 DIAGNOSIS — Z9889 Other specified postprocedural states: Secondary | ICD-10-CM | POA: Insufficient documentation

## 2014-04-14 DIAGNOSIS — R109 Unspecified abdominal pain: Secondary | ICD-10-CM

## 2014-04-14 DIAGNOSIS — Z7902 Long term (current) use of antithrombotics/antiplatelets: Secondary | ICD-10-CM | POA: Insufficient documentation

## 2014-04-14 DIAGNOSIS — Z79899 Other long term (current) drug therapy: Secondary | ICD-10-CM | POA: Diagnosis not present

## 2014-04-14 DIAGNOSIS — E119 Type 2 diabetes mellitus without complications: Secondary | ICD-10-CM | POA: Insufficient documentation

## 2014-04-14 DIAGNOSIS — Z87891 Personal history of nicotine dependence: Secondary | ICD-10-CM | POA: Diagnosis not present

## 2014-04-14 DIAGNOSIS — Z9861 Coronary angioplasty status: Secondary | ICD-10-CM | POA: Insufficient documentation

## 2014-04-14 DIAGNOSIS — R1013 Epigastric pain: Secondary | ICD-10-CM | POA: Diagnosis not present

## 2014-04-14 DIAGNOSIS — I252 Old myocardial infarction: Secondary | ICD-10-CM | POA: Insufficient documentation

## 2014-04-14 DIAGNOSIS — G8929 Other chronic pain: Secondary | ICD-10-CM | POA: Insufficient documentation

## 2014-04-14 DIAGNOSIS — Z85850 Personal history of malignant neoplasm of thyroid: Secondary | ICD-10-CM | POA: Insufficient documentation

## 2014-04-14 LAB — COMPREHENSIVE METABOLIC PANEL
ALK PHOS: 65 U/L (ref 39–117)
ALT: 45 U/L (ref 0–53)
AST: 47 U/L — ABNORMAL HIGH (ref 0–37)
Albumin: 3.8 g/dL (ref 3.5–5.2)
Anion gap: 12 (ref 5–15)
BUN: 13 mg/dL (ref 6–23)
CHLORIDE: 101 meq/L (ref 96–112)
CO2: 29 meq/L (ref 19–32)
Calcium: 9.2 mg/dL (ref 8.4–10.5)
Creatinine, Ser: 1 mg/dL (ref 0.50–1.35)
GFR, EST NON AFRICAN AMERICAN: 79 mL/min — AB (ref >90)
GLUCOSE: 115 mg/dL — AB (ref 70–99)
Potassium: 4.4 mEq/L (ref 3.7–5.3)
SODIUM: 142 meq/L (ref 137–147)
Total Bilirubin: 0.3 mg/dL (ref 0.3–1.2)
Total Protein: 7.9 g/dL (ref 6.0–8.3)

## 2014-04-14 LAB — CBC WITH DIFFERENTIAL/PLATELET
Basophils Absolute: 0.1 10*3/uL (ref 0.0–0.1)
Basophils Relative: 1 % (ref 0–1)
Eosinophils Absolute: 0.2 10*3/uL (ref 0.0–0.7)
Eosinophils Relative: 4 % (ref 0–5)
HCT: 38.8 % — ABNORMAL LOW (ref 39.0–52.0)
HEMOGLOBIN: 12.6 g/dL — AB (ref 13.0–17.0)
LYMPHS ABS: 1.4 10*3/uL (ref 0.7–4.0)
LYMPHS PCT: 31 % (ref 12–46)
MCH: 23.2 pg — ABNORMAL LOW (ref 26.0–34.0)
MCHC: 32.5 g/dL (ref 30.0–36.0)
MCV: 71.3 fL — ABNORMAL LOW (ref 78.0–100.0)
MONOS PCT: 8 % (ref 3–12)
Monocytes Absolute: 0.4 10*3/uL (ref 0.1–1.0)
Neutro Abs: 2.5 10*3/uL (ref 1.7–7.7)
Neutrophils Relative %: 56 % (ref 43–77)
PLATELETS: 238 10*3/uL (ref 150–400)
RBC: 5.44 MIL/uL (ref 4.22–5.81)
RDW: 17.1 % — ABNORMAL HIGH (ref 11.5–15.5)
WBC: 4.5 10*3/uL (ref 4.0–10.5)

## 2014-04-14 LAB — LIPASE, BLOOD: Lipase: 20 U/L (ref 11–59)

## 2014-04-14 MED ORDER — ONDANSETRON HCL 4 MG/2ML IJ SOLN
4.0000 mg | Freq: Once | INTRAMUSCULAR | Status: AC
  Administered 2014-04-14: 4 mg via INTRAVENOUS
  Filled 2014-04-14: qty 2

## 2014-04-14 MED ORDER — HYDROMORPHONE HCL PF 1 MG/ML IJ SOLN
0.5000 mg | Freq: Once | INTRAMUSCULAR | Status: AC
  Administered 2014-04-14: 0.5 mg via INTRAVENOUS
  Filled 2014-04-14: qty 1

## 2014-04-14 MED ORDER — HYDROMORPHONE HCL PF 1 MG/ML IJ SOLN
1.0000 mg | Freq: Once | INTRAMUSCULAR | Status: AC
  Administered 2014-04-14: 1 mg via INTRAVENOUS
  Filled 2014-04-14: qty 1

## 2014-04-14 MED ORDER — SODIUM CHLORIDE 0.9 % IV BOLUS (SEPSIS)
1000.0000 mL | Freq: Once | INTRAVENOUS | Status: AC
  Administered 2014-04-14: 1000 mL via INTRAVENOUS

## 2014-04-14 NOTE — ED Provider Notes (Signed)
Medical screening examination/treatment/procedure(s) were performed by non-physician practitioner and as supervising physician I was immediately available for consultation/collaboration.     Veryl Speak, MD 04/14/14 [phone]

## 2014-04-14 NOTE — Discharge Instructions (Signed)

## 2014-04-14 NOTE — ED Notes (Signed)
Lab called to ? Troponin order-spoke with EDPA-states she did not intend to order troponin

## 2014-04-14 NOTE — ED Notes (Signed)
Patient states he woke up this morning with upper abdominal pain and vomiting.  Has a history of chronic pancreatitis.

## 2014-04-14 NOTE — ED Provider Notes (Signed)
CSN: [id number]     Arrival date & time 04/14/14  1209 History   First MD Initiated Contact with Patient 04/14/14 1224     Chief Complaint  Patient presents with  . Emesis    Hx of pancreatitis     (Consider location/radiation/quality/duration/timing/severity/associated sxs/prior Treatment) HPI Comments: Patient is a 63 year old male with a past medical history of hypertension, chronic pancreatitis, diabetes, MI, stroke and chronic back pain who presents to the emergency department complaining of an exacerbation of his chronic pancreatitis x1 day. Patient reports when he woke up this morning his abdominal pain gradually worsened. Pain located mid epigastric, constant, nonradiating rated 10/10. Pain unrelieved by Tylenol. Admits to associated nausea and vomiting. States he had 4 episodes of nonbloody, nonbilious emesis. Denies fever, chills, diarrhea, chest pain, shortness of breath or urinary symptoms. States this feels similar to his prior pancreatitis flares. Yesterday for dinner he had spaghetti. Denies any alcohol use.  Patient is a 63 y.o. male presenting with vomiting. The history is provided by the patient.  Emesis Associated symptoms: abdominal pain     Past Medical History  Diagnosis Date  . Hypertension   . Pancreatitis   . Myocardial infarct   . Diabetes mellitus without complication   . Chronic back pain   . Thyroid cancer   . Stroke     multiple in sept and oct 2013   Past Surgical History  Procedure Laterality Date  . Thyroidectomy    . Pancreas surgery    . Knee surgery    . Lumbar epidural injection    . Angioplasty     No family history on file. History  Substance Use Topics  . Smoking status: Former Smoker    Quit date: 06/08/2012  . Smokeless tobacco: Never Used  . Alcohol Use: No    Review of Systems  Gastrointestinal: Positive for nausea, vomiting and abdominal pain.  All other systems reviewed and are negative.     Allergies  Ivp dye and  Spinach  Home Medications   Prior to Admission medications   Medication Sig Start Date End Date Taking? Authorizing Provider  ALPRAZOLAM PO Take by mouth 3 (three) times daily.   Yes Historical Provider, MD  amLODipine (NORVASC) 10 MG tablet Take 10 mg by mouth every morning.    Yes Historical Provider, MD  aspirin 325 MG EC tablet Take 81 mg by mouth every morning.    Yes Historical Provider, MD  atenolol (TENORMIN) 100 MG tablet Take 100 mg by mouth every morning.    Yes Historical Provider, MD  atorvastatin (LIPITOR) 20 MG tablet Take 20 mg by mouth at bedtime.     Yes Historical Provider, MD  carbamazepine (TEGRETOL-XR) 100 MG 12 hr tablet Take 1 tablet (100 mg total) by mouth 2 (two) times daily. 05/18/13  Yes Orpah Greek, MD  clopidogrel (PLAVIX) 75 MG tablet Take 75 mg by mouth every morning.    Yes Historical Provider, MD  Diazepam (VALIUM PO) Take by mouth.   Yes Historical Provider, MD  esomeprazole (NEXIUM) 40 MG capsule Take 40 mg by mouth daily before breakfast.     Yes Historical Provider, MD  fentaNYL (DURAGESIC - DOSED MCG/HR) 75 MCG/HR Place 1 patch onto the skin every 3 (three) days.   Yes Historical Provider, MD  furosemide (LASIX) 40 MG tablet Take 40 mg by mouth every morning.    Yes Historical Provider, MD  hydrocortisone 2.5 % cream Apply topically 2 (two) times daily as  needed. Hemorids    Yes Historical Provider, MD  isosorbide mononitrate (IMDUR) 30 MG 24 hr tablet Take 15 mg by mouth every morning.    Yes Historical Provider, MD  levothyroxine (SYNTHROID, LEVOTHROID) 300 MCG tablet Take 300 mcg by mouth daily.     Yes Historical Provider, MD  lidocaine (LIDODERM) 5 % Place 1 patch onto the skin daily. Remove & Discard patch within 12 hours or as directed by MD   Yes Historical Provider, MD  losartan-hydrochlorothiazide (HYZAAR) 100-25 MG per tablet Take 1 tablet by mouth every morning.    Yes Historical Provider, MD  metFORMIN (GLUCOPHAGE) 500 MG tablet  Take 500 mg by mouth 2 (two) times daily with a meal.   Yes Historical Provider, MD  nitroGLYCERIN (NITROSTAT) 0.4 MG SL tablet Place 0.4 mg under the tongue every 5 (five) minutes x 3 doses as needed. Chest pain   Yes Historical Provider, MD  ondansetron (ZOFRAN) 4 MG tablet Take 4 mg by mouth every 8 (eight) hours as needed for nausea.   Yes Historical Provider, MD  promethazine (PHENERGAN) 25 MG tablet Take 25 mg by mouth every 6 (six) hours as needed. nausea   Yes Historical Provider, MD  promethazine (PHENERGAN) 25 MG tablet Take 1 tablet (25 mg total) by mouth every 6 (six) hours as needed for nausea or vomiting. 03/29/14  Yes Shari A Upstill, PA-C  SERTRALINE HCL PO Take by mouth at bedtime.   Yes Historical Provider, MD  baclofen (LIORESAL) 10 MG tablet Take 1 tablet (10 mg total) by mouth 3 (three) times daily. 11/28/13   Tammy L. Triplett, PA-C  cloNIDine (CATAPRES) 0.2 MG tablet Take 0.2 mg by mouth at bedtime.      Historical Provider, MD  gabapentin (NEURONTIN) 600 MG tablet Take 900 mg by mouth 3 (three) times daily as needed. Nerve pain    Historical Provider, MD  HYDROcodone-acetaminophen (NORCO) 5-325 MG per tablet Take 1 tablet by mouth every 6 (six) hours as needed. pain     Historical Provider, MD  HYDROcodone-acetaminophen (NORCO) 5-325 MG per tablet Take 1 tablet by mouth every 6 (six) hours as needed for pain. 08/12/12   Babette Relic, MD  HYDROcodone-acetaminophen (NORCO) 5-325 MG per tablet Take 1 tablet by mouth every 6 (six) hours as needed for pain. 02/02/13   Tatyana A Kirichenko, PA-C  HYDROcodone-acetaminophen (NORCO) 5-325 MG per tablet Take 1-2 tablets by mouth every 6 (six) hours as needed for severe pain. 07/25/13   Orlie Dakin, MD  HYDROcodone-acetaminophen (NORCO/VICODIN) 5-325 MG per tablet Take 2 tablets by mouth every 4 (four) hours as needed for pain. 05/18/13   Orpah Greek, MD  HYDROcodone-acetaminophen (NORCO/VICODIN) 5-325 MG per tablet Take 2  tablets by mouth every 4 (four) hours as needed. 09/18/13   [name], MD  HYDROcodone-acetaminophen (NORCO/VICODIN) 5-325 MG per tablet Take 1-2 tablets by mouth every 4 (four) hours as needed. 03/29/14   Shari A Upstill, PA-C  imipramine (TOFRANIL) 25 MG tablet Take 25 mg by mouth at bedtime.      Historical Provider, MD  ondansetron (ZOFRAN) 4 MG tablet Take 1 tablet (4 mg total) by mouth every 6 (six) hours. 09/18/13   [name], MD  Pancrelipase, Lip-Prot-Amyl, (ZENPEP) 5000 UNITS CPEP Take 3 capsules by mouth at bedtime.      Historical Provider, MD  potassium chloride (KLOR-CON) 10 MEQ CR tablet Take 10 mEq by mouth every morning.     Historical Provider, MD  BP 117/83  Pulse 62  Temp(Src) 98.2 F (36.8 C) (Oral)  Resp 20  SpO2 96% Physical Exam  Nursing note and vitals reviewed. Constitutional: He is oriented to person, place, and time. He appears well-developed and well-nourished. No distress.  HENT:  Head: Normocephalic and atraumatic.  Mouth/Throat: Oropharynx is clear and moist.  Eyes: Conjunctivae and EOM are normal. Pupils are equal, round, and reactive to light. No scleral icterus.  Neck: Normal range of motion. Neck supple. No JVD present.  Cardiovascular: Normal rate, regular rhythm, normal heart sounds and intact distal pulses.   No extremity edema.  Pulmonary/Chest: Effort normal and breath sounds normal. No respiratory distress.  Abdominal: Soft. Bowel sounds are normal. There is tenderness.  Mid-epigastric tenderness. No rigidity, guarding, rebound. No peritoneal signs.  Musculoskeletal: Normal range of motion. He exhibits no edema.  Neurological: He is alert and oriented to person, place, and time. He has normal strength. No sensory deficit.  Speech fluent, goal oriented. Moves limbs without ataxia. Equal grip strength bilateral.  Skin: Skin is warm and dry. He is not diaphoretic.  Psychiatric: He has a normal mood and affect. His behavior is normal.     ED Course  Procedures (including critical care time) Labs Review Labs Reviewed  CBC WITH DIFFERENTIAL - Abnormal; Notable for the following:    Hemoglobin 12.6 (*)    HCT 38.8 (*)    MCV 71.3 (*)    MCH 23.2 (*)    RDW 17.1 (*)    All other components within normal limits  COMPREHENSIVE METABOLIC PANEL - Abnormal; Notable for the following:    Glucose, Bld 115 (*)    AST 47 (*)    GFR calc non Af Amer 79 (*)    All other components within normal limits  LIPASE, BLOOD  URINALYSIS, ROUTINE W REFLEX MICROSCOPIC  TROPONIN I    Imaging Review No results found.   EKG Interpretation None      MDM   Final diagnoses:  Chronic abdominal pain   Patient presenting with abdominal pain, similar to his chronic pancreatitis flares. He is nontoxic appearing and in no apparent distress. Afebrile, vital signs stable. Labs pending. Plan to give IV fluids, pain and nausea control.  2:35 PM Patient reports his pain is significantly improved. Tolerating PO. Stable for discharge. Followup with PCP. Return precautions given. Patient states understanding of treatment care plan and is agreeable.  Illene Labrador, PA-C 04/14/14 1436

## 2014-04-21 ENCOUNTER — Encounter (HOSPITAL_BASED_OUTPATIENT_CLINIC_OR_DEPARTMENT_OTHER): Payer: Self-pay | Admitting: Emergency Medicine

## 2014-04-21 ENCOUNTER — Emergency Department (HOSPITAL_BASED_OUTPATIENT_CLINIC_OR_DEPARTMENT_OTHER)
Admission: EM | Admit: 2014-04-21 | Discharge: 2014-04-21 | Disposition: A | Discharge status: Home / Self Care | Payer: Medicare HMO | Attending: Emergency Medicine | Admitting: Emergency Medicine

## 2014-04-21 DIAGNOSIS — I10 Essential (primary) hypertension: Secondary | ICD-10-CM | POA: Diagnosis not present

## 2014-04-21 DIAGNOSIS — I252 Old myocardial infarction: Secondary | ICD-10-CM | POA: Insufficient documentation

## 2014-04-21 DIAGNOSIS — Z8719 Personal history of other diseases of the digestive system: Secondary | ICD-10-CM | POA: Diagnosis not present

## 2014-04-21 DIAGNOSIS — Z85850 Personal history of malignant neoplasm of thyroid: Secondary | ICD-10-CM | POA: Insufficient documentation

## 2014-04-21 DIAGNOSIS — Z9889 Other specified postprocedural states: Secondary | ICD-10-CM | POA: Diagnosis not present

## 2014-04-21 DIAGNOSIS — Z79899 Other long term (current) drug therapy: Secondary | ICD-10-CM | POA: Diagnosis not present

## 2014-04-21 DIAGNOSIS — Z7982 Long term (current) use of aspirin: Secondary | ICD-10-CM | POA: Diagnosis not present

## 2014-04-21 DIAGNOSIS — Z8673 Personal history of transient ischemic attack (TIA), and cerebral infarction without residual deficits: Secondary | ICD-10-CM | POA: Insufficient documentation

## 2014-04-21 DIAGNOSIS — G8929 Other chronic pain: Secondary | ICD-10-CM

## 2014-04-21 DIAGNOSIS — E119 Type 2 diabetes mellitus without complications: Secondary | ICD-10-CM | POA: Insufficient documentation

## 2014-04-21 DIAGNOSIS — R109 Unspecified abdominal pain: Secondary | ICD-10-CM | POA: Diagnosis present

## 2014-04-21 DIAGNOSIS — R1013 Epigastric pain: Secondary | ICD-10-CM | POA: Insufficient documentation

## 2014-04-21 DIAGNOSIS — Z87891 Personal history of nicotine dependence: Secondary | ICD-10-CM | POA: Diagnosis not present

## 2014-04-21 LAB — CBC WITH DIFFERENTIAL/PLATELET
BASOS ABS: 0.1 10*3/uL (ref 0.0–0.1)
BASOS PCT: 1 % (ref 0–1)
EOS ABS: 0.2 10*3/uL (ref 0.0–0.7)
Eosinophils Relative: 4 % (ref 0–5)
HCT: 35.8 % — ABNORMAL LOW (ref 39.0–52.0)
Hemoglobin: 11.8 g/dL — ABNORMAL LOW (ref 13.0–17.0)
LYMPHS PCT: 42 % (ref 12–46)
Lymphs Abs: 1.8 10*3/uL (ref 0.7–4.0)
MCH: 23.2 pg — ABNORMAL LOW (ref 26.0–34.0)
MCHC: 33 g/dL (ref 30.0–36.0)
MCV: 70.5 fL — AB (ref 78.0–100.0)
Monocytes Absolute: 0.4 10*3/uL (ref 0.1–1.0)
Monocytes Relative: 10 % (ref 3–12)
Neutro Abs: 1.9 10*3/uL (ref 1.7–7.7)
Neutrophils Relative %: 43 % (ref 43–77)
PLATELETS: 238 10*3/uL (ref 150–400)
RBC: 5.08 MIL/uL (ref 4.22–5.81)
RDW: 16.8 % — ABNORMAL HIGH (ref 11.5–15.5)
WBC: 4.3 10*3/uL (ref 4.0–10.5)

## 2014-04-21 LAB — COMPREHENSIVE METABOLIC PANEL
ALT: 24 U/L (ref 0–53)
AST: 27 U/L (ref 0–37)
Albumin: 3.6 g/dL (ref 3.5–5.2)
Alkaline Phosphatase: 69 U/L (ref 39–117)
Anion gap: 14 (ref 5–15)
BILIRUBIN TOTAL: 0.4 mg/dL (ref 0.3–1.2)
BUN: 13 mg/dL (ref 6–23)
CALCIUM: 9 mg/dL (ref 8.4–10.5)
CHLORIDE: 101 meq/L (ref 96–112)
CO2: 26 meq/L (ref 19–32)
CREATININE: 1.2 mg/dL (ref 0.50–1.35)
GFR calc non Af Amer: 64 mL/min — ABNORMAL LOW (ref >90)
GFR, EST AFRICAN AMERICAN: 74 mL/min — AB (ref >90)
GLUCOSE: 130 mg/dL — AB (ref 70–99)
Potassium: 3.7 mEq/L (ref 3.7–5.3)
Sodium: 141 mEq/L (ref 137–147)
Total Protein: 7.4 g/dL (ref 6.0–8.3)

## 2014-04-21 LAB — LIPASE, BLOOD: Lipase: 20 U/L (ref 11–59)

## 2014-04-21 MED ORDER — HYDROMORPHONE HCL PF 1 MG/ML IJ SOLN
1.0000 mg | Freq: Once | INTRAMUSCULAR | Status: AC
  Administered 2014-04-21: 1 mg via INTRAVENOUS
  Filled 2014-04-21: qty 1

## 2014-04-21 MED ORDER — MORPHINE SULFATE 4 MG/ML IJ SOLN
4.0000 mg | Freq: Once | INTRAMUSCULAR | Status: AC
  Administered 2014-04-21: 4 mg via INTRAVENOUS
  Filled 2014-04-21: qty 1

## 2014-04-21 MED ORDER — ONDANSETRON HCL 4 MG/2ML IJ SOLN
4.0000 mg | Freq: Once | INTRAMUSCULAR | Status: AC
  Administered 2014-04-21: 4 mg via INTRAVENOUS
  Filled 2014-04-21: qty 2

## 2014-04-21 NOTE — ED Provider Notes (Signed)
CSN: [id number]     Arrival date & time 04/21/14  1217 History   First MD Initiated Contact with Patient 04/21/14 1228     Chief Complaint  Patient presents with  . Abdominal Pain     (Consider location/radiation/quality/duration/timing/severity/associated sxs/prior Treatment) HPI Comments: Pt states that he has a chronic history of pancreatitis. Pt states that he is seeing a gi doctor and it supposed to see a specialist at [place] this episode started about 3 weeks ago. Similar to previous episodes. Pain in the epigastric area and vomiting. No fever.pt states that they haven't figured out what caused the pancreatitis.   The history is provided by the patient. No language interpreter was used.    Past Medical History  Diagnosis Date  . Hypertension   . Pancreatitis   . Myocardial infarct   . Diabetes mellitus without complication   . Chronic back pain   . Thyroid cancer   . Stroke     multiple in sept and oct 2013   Past Surgical History  Procedure Laterality Date  . Thyroidectomy    . Pancreas surgery    . Knee surgery    . Lumbar epidural injection    . Angioplasty     No family history on file. History  Substance Use Topics  . Smoking status: Former Smoker    Quit date: 06/08/2012  . Smokeless tobacco: Never Used  . Alcohol Use: No    Review of Systems  Constitutional: Negative.   HENT: Negative.   Respiratory: Negative.   Cardiovascular: Negative for chest pain.      Allergies  Ivp dye and Spinach  Home Medications   Prior to Admission medications   Medication Sig Start Date End Date Taking? Authorizing Provider  ALPRAZOLAM PO Take by mouth 3 (three) times daily.    Historical Provider, MD  amLODipine (NORVASC) 10 MG tablet Take 10 mg by mouth every morning.     Historical Provider, MD  aspirin 325 MG EC tablet Take 81 mg by mouth every morning.     Historical Provider, MD  atenolol (TENORMIN) 100 MG tablet Take 100 mg by mouth every morning.      Historical Provider, MD  atorvastatin (LIPITOR) 20 MG tablet Take 20 mg by mouth at bedtime.      Historical Provider, MD  baclofen (LIORESAL) 10 MG tablet Take 1 tablet (10 mg total) by mouth 3 (three) times daily. 11/28/13   Tammy L. Triplett, PA-C  carbamazepine (TEGRETOL-XR) 100 MG 12 hr tablet Take 1 tablet (100 mg total) by mouth 2 (two) times daily. 05/18/13   Orpah Greek, MD  cloNIDine (CATAPRES) 0.2 MG tablet Take 0.2 mg by mouth at bedtime.      Historical Provider, MD  clopidogrel (PLAVIX) 75 MG tablet Take 75 mg by mouth every morning.     Historical Provider, MD  Diazepam (VALIUM PO) Take by mouth.    Historical Provider, MD  esomeprazole (NEXIUM) 40 MG capsule Take 40 mg by mouth daily before breakfast.      Historical Provider, MD  fentaNYL (DURAGESIC - DOSED MCG/HR) 75 MCG/HR Place 1 patch onto the skin every 3 (three) days.    Historical Provider, MD  furosemide (LASIX) 40 MG tablet Take 40 mg by mouth every morning.     Historical Provider, MD  gabapentin (NEURONTIN) 600 MG tablet Take 900 mg by mouth 3 (three) times daily as needed. Nerve pain    Historical Provider, MD  HYDROcodone-acetaminophen [facility])  5-325 MG per tablet Take 1 tablet by mouth every 6 (six) hours as needed. pain     Historical Provider, MD  HYDROcodone-acetaminophen (NORCO) 5-325 MG per tablet Take 1 tablet by mouth every 6 (six) hours as needed for pain. 08/12/12   Babette Relic, MD  HYDROcodone-acetaminophen (NORCO) 5-325 MG per tablet Take 1 tablet by mouth every 6 (six) hours as needed for pain. 02/02/13   Tatyana A Kirichenko, PA-C  HYDROcodone-acetaminophen (NORCO) 5-325 MG per tablet Take 1-2 tablets by mouth every 6 (six) hours as needed for severe pain. 07/25/13   Orlie Dakin, MD  HYDROcodone-acetaminophen (NORCO/VICODIN) 5-325 MG per tablet Take 2 tablets by mouth every 4 (four) hours as needed for pain. 05/18/13   Orpah Greek, MD  HYDROcodone-acetaminophen (NORCO/VICODIN) 5-325  MG per tablet Take 2 tablets by mouth every 4 (four) hours as needed. 09/18/13   [name], MD  HYDROcodone-acetaminophen (NORCO/VICODIN) 5-325 MG per tablet Take 1-2 tablets by mouth every 4 (four) hours as needed. 03/29/14   Shari A Upstill, PA-C  hydrocortisone 2.5 % cream Apply topically 2 (two) times daily as needed. Hemorids     Historical Provider, MD  imipramine (TOFRANIL) 25 MG tablet Take 25 mg by mouth at bedtime.      Historical Provider, MD  isosorbide mononitrate (IMDUR) 30 MG 24 hr tablet Take 15 mg by mouth every morning.     Historical Provider, MD  levothyroxine (SYNTHROID, LEVOTHROID) 300 MCG tablet Take 300 mcg by mouth daily.      Historical Provider, MD  lidocaine (LIDODERM) 5 % Place 1 patch onto the skin daily. Remove & Discard patch within 12 hours or as directed by MD    Historical Provider, MD  losartan-hydrochlorothiazide (HYZAAR) 100-25 MG per tablet Take 1 tablet by mouth every morning.     Historical Provider, MD  metFORMIN (GLUCOPHAGE) 500 MG tablet Take 500 mg by mouth 2 (two) times daily with a meal.    Historical Provider, MD  nitroGLYCERIN (NITROSTAT) 0.4 MG SL tablet Place 0.4 mg under the tongue every 5 (five) minutes x 3 doses as needed. Chest pain    Historical Provider, MD  ondansetron (ZOFRAN) 4 MG tablet Take 4 mg by mouth every 8 (eight) hours as needed for nausea.    Historical Provider, MD  ondansetron (ZOFRAN) 4 MG tablet Take 1 tablet (4 mg total) by mouth every 6 (six) hours. 09/18/13   [name], MD  Pancrelipase, Lip-Prot-Amyl, (ZENPEP) 5000 UNITS CPEP Take 3 capsules by mouth at bedtime.      Historical Provider, MD  potassium chloride (KLOR-CON) 10 MEQ CR tablet Take 10 mEq by mouth every morning.     Historical Provider, MD  promethazine (PHENERGAN) 25 MG tablet Take 25 mg by mouth every 6 (six) hours as needed. nausea    Historical Provider, MD  promethazine (PHENERGAN) 25 MG tablet Take 1 tablet (25 mg total) by mouth every 6 (six) hours  as needed for nausea or vomiting. 03/29/14   Nehemiah Settle A Upstill, PA-C  SERTRALINE HCL PO Take by mouth at bedtime.    Historical Provider, MD   BP 122/78  Pulse 57  Temp(Src) 98 F (36.7 C) (Oral)  Resp 20  Ht 6\' 1"  (1.854 m)  Wt 240 lb (108.863 kg)  BMI 31.67 kg/m2  SpO2 99% Physical Exam  Nursing note and vitals reviewed. Constitutional: He is oriented to person, place, and time. He appears well-developed and well-nourished.  Cardiovascular: Normal rate and regular  rhythm.   Pulmonary/Chest: Effort normal and breath sounds normal.  Abdominal: Soft. Bowel sounds are normal. There is tenderness in the epigastric area.  Musculoskeletal: Normal range of motion.  Neurological: He is alert and oriented to person, place, and time. Coordination normal.  Skin: Skin is warm and dry.  Psychiatric: He has a normal mood and affect.    ED Course  Procedures (including critical care time) Labs Review Labs Reviewed  COMPREHENSIVE METABOLIC PANEL - Abnormal; Notable for the following:    Glucose, Bld 130 (*)    GFR calc non Af Amer 64 (*)    GFR calc Af Amer 74 (*)    All other components within normal limits  CBC WITH DIFFERENTIAL - Abnormal; Notable for the following:    Hemoglobin 11.8 (*)    HCT 35.8 (*)    MCV 70.5 (*)    MCH 23.2 (*)    RDW 16.8 (*)    All other components within normal limits  LIPASE, BLOOD    Imaging Review No results found.   EKG Interpretation None      MDM   Final diagnoses:  Chronic abdominal pain    Pt doing better after pain medication. Pt is okay to go home. This is chronic pain and pt can have his doctor prescribe pain medication    Glendell Docker, NP 04/21/14 1516

## 2014-04-21 NOTE — ED Notes (Signed)
Epigastric pain x 3 weeks. Vomiting.

## 2014-04-21 NOTE — Discharge Instructions (Signed)

## 2014-04-25 NOTE — ED Provider Notes (Signed)
Medical screening examination/treatment/procedure(s) were performed by non-physician practitioner and as supervising physician I was immediately available for consultation/collaboration.   EKG Interpretation   Date/Time:  Tuesday April 21 2014 12:37:49 EDT Ventricular Rate:  60 PR Interval:  152 QRS Duration: 86 QT Interval:  436 QTC Calculation: 436 R Axis:   24 Text Interpretation:  Normal sinus rhythm Minimal voltage criteria for  LVH, may be normal variant Nonspecific T wave abnormality Abnormal ECG  since last tracing no significant change Confirmed by Sehaj Kolden  MD, Zeffie Bickert  ([id number]) on 04/21/2014 2:49:41 PM        Malvin Johns, MD 04/25/14 1950

## 2014-10-17 ENCOUNTER — Emergency Department (HOSPITAL_BASED_OUTPATIENT_CLINIC_OR_DEPARTMENT_OTHER): Payer: Medicare HMO

## 2014-10-17 ENCOUNTER — Emergency Department (HOSPITAL_BASED_OUTPATIENT_CLINIC_OR_DEPARTMENT_OTHER)
Admission: EM | Admit: 2014-10-17 | Discharge: 2014-10-17 | Disposition: A | Discharge status: Home / Self Care | Payer: Medicare HMO | Attending: Emergency Medicine | Admitting: Emergency Medicine

## 2014-10-17 ENCOUNTER — Encounter (HOSPITAL_BASED_OUTPATIENT_CLINIC_OR_DEPARTMENT_OTHER): Payer: Self-pay

## 2014-10-17 DIAGNOSIS — K861 Other chronic pancreatitis: Secondary | ICD-10-CM | POA: Insufficient documentation

## 2014-10-17 DIAGNOSIS — Z85850 Personal history of malignant neoplasm of thyroid: Secondary | ICD-10-CM | POA: Diagnosis not present

## 2014-10-17 DIAGNOSIS — I10 Essential (primary) hypertension: Secondary | ICD-10-CM | POA: Insufficient documentation

## 2014-10-17 DIAGNOSIS — E119 Type 2 diabetes mellitus without complications: Secondary | ICD-10-CM | POA: Insufficient documentation

## 2014-10-17 DIAGNOSIS — Z79899 Other long term (current) drug therapy: Secondary | ICD-10-CM | POA: Insufficient documentation

## 2014-10-17 DIAGNOSIS — I252 Old myocardial infarction: Secondary | ICD-10-CM | POA: Diagnosis not present

## 2014-10-17 DIAGNOSIS — Z7952 Long term (current) use of systemic steroids: Secondary | ICD-10-CM | POA: Insufficient documentation

## 2014-10-17 DIAGNOSIS — Z9889 Other specified postprocedural states: Secondary | ICD-10-CM | POA: Diagnosis not present

## 2014-10-17 DIAGNOSIS — Z7982 Long term (current) use of aspirin: Secondary | ICD-10-CM | POA: Insufficient documentation

## 2014-10-17 DIAGNOSIS — Z8673 Personal history of transient ischemic attack (TIA), and cerebral infarction without residual deficits: Secondary | ICD-10-CM | POA: Diagnosis not present

## 2014-10-17 DIAGNOSIS — R109 Unspecified abdominal pain: Secondary | ICD-10-CM | POA: Diagnosis present

## 2014-10-17 DIAGNOSIS — G8929 Other chronic pain: Secondary | ICD-10-CM

## 2014-10-17 DIAGNOSIS — M549 Dorsalgia, unspecified: Secondary | ICD-10-CM

## 2014-10-17 DIAGNOSIS — Z7902 Long term (current) use of antithrombotics/antiplatelets: Secondary | ICD-10-CM | POA: Diagnosis not present

## 2014-10-17 LAB — COMPREHENSIVE METABOLIC PANEL
ALBUMIN: 3.9 g/dL (ref 3.5–5.2)
ALK PHOS: 68 U/L (ref 39–117)
ALT: 25 U/L (ref 0–53)
ANION GAP: 10 (ref 5–15)
AST: 28 U/L (ref 0–37)
BUN: 10 mg/dL (ref 6–23)
CHLORIDE: 102 mmol/L (ref 96–112)
CO2: 28 mmol/L (ref 19–32)
Calcium: 8.4 mg/dL (ref 8.4–10.5)
Creatinine, Ser: 1.07 mg/dL (ref 0.50–1.35)
GFR, EST AFRICAN AMERICAN: 84 mL/min — AB (ref >90)
GFR, EST NON AFRICAN AMERICAN: 72 mL/min — AB (ref >90)
GLUCOSE: 128 mg/dL — AB (ref 70–99)
Potassium: 3.6 mmol/L (ref 3.5–5.1)
Sodium: 140 mmol/L (ref 135–145)
Total Bilirubin: 0.5 mg/dL (ref 0.3–1.2)
Total Protein: 7.8 g/dL (ref 6.0–8.3)

## 2014-10-17 LAB — URINALYSIS, ROUTINE W REFLEX MICROSCOPIC
Bilirubin Urine: NEGATIVE
GLUCOSE, UA: NEGATIVE mg/dL
Ketones, ur: NEGATIVE mg/dL
LEUKOCYTES UA: NEGATIVE
NITRITE: NEGATIVE
Protein, ur: NEGATIVE mg/dL
Specific Gravity, Urine: 1.011 (ref 1.005–1.030)
UROBILINOGEN UA: 0.2 mg/dL (ref 0.0–1.0)
pH: 6.5 (ref 5.0–8.0)

## 2014-10-17 LAB — URINE MICROSCOPIC-ADD ON

## 2014-10-17 LAB — CBC WITH DIFFERENTIAL/PLATELET
Basophils Absolute: 0 10*3/uL (ref 0.0–0.1)
Basophils Relative: 1 % (ref 0–1)
EOS ABS: 0 10*3/uL (ref 0.0–0.7)
Eosinophils Relative: 0 % (ref 0–5)
HCT: 38.5 % — ABNORMAL LOW (ref 39.0–52.0)
Hemoglobin: 12.5 g/dL — ABNORMAL LOW (ref 13.0–17.0)
Lymphocytes Relative: 29 % (ref 12–46)
Lymphs Abs: 1.4 10*3/uL (ref 0.7–4.0)
MCH: 23.5 pg — AB (ref 26.0–34.0)
MCHC: 32.5 g/dL (ref 30.0–36.0)
MCV: 72.5 fL — AB (ref 78.0–100.0)
MONO ABS: 0.2 10*3/uL (ref 0.1–1.0)
Monocytes Relative: 5 % (ref 3–12)
Neutro Abs: 3.1 10*3/uL (ref 1.7–7.7)
Neutrophils Relative %: 65 % (ref 43–77)
Platelets: 241 10*3/uL (ref 150–400)
RBC: 5.31 MIL/uL (ref 4.22–5.81)
RDW: 16.1 % — ABNORMAL HIGH (ref 11.5–15.5)
WBC: 4.8 10*3/uL (ref 4.0–10.5)

## 2014-10-17 LAB — AMYLASE: AMYLASE: 63 U/L (ref 0–105)

## 2014-10-17 LAB — LIPASE, BLOOD: Lipase: 25 U/L (ref 11–59)

## 2014-10-17 MED ORDER — PROMETHAZINE HCL 25 MG RE SUPP: 25.0000 mg | Freq: Four times a day (QID) | RECTAL | Status: AC | PRN

## 2014-10-17 MED ORDER — HYDROMORPHONE HCL 1 MG/ML IJ SOLN
1.0000 mg | Freq: Once | INTRAMUSCULAR | Status: AC
  Administered 2014-10-17: 1 mg via INTRAVENOUS
  Filled 2014-10-17: qty 1

## 2014-10-17 MED ORDER — SODIUM CHLORIDE 0.9 % IV BOLUS (SEPSIS)
1000.0000 mL | Freq: Once | INTRAVENOUS | Status: AC
  Administered 2014-10-17: 1000 mL via INTRAVENOUS

## 2014-10-17 MED ORDER — ONDANSETRON 8 MG PO TBDP: 8.0000 mg | ORAL_TABLET | Freq: Three times a day (TID) | ORAL | Status: DC | PRN

## 2014-10-17 MED ORDER — ONDANSETRON HCL 4 MG/2ML IJ SOLN
4.0000 mg | Freq: Once | INTRAMUSCULAR | Status: AC
  Administered 2014-10-17: 4 mg via INTRAVENOUS
  Filled 2014-10-17: qty 2

## 2014-10-17 MED ORDER — HYDROCODONE-ACETAMINOPHEN 5-325 MG PO TABS: 1.0000 | ORAL_TABLET | Freq: Four times a day (QID) | ORAL | Status: DC | PRN

## 2014-10-17 NOTE — Discharge Instructions (Signed)
You likely have pancreatitis flareup. Take the meds for pain as prescribed.   Clear liquid diet for 2-3 days, and slowly advance to normal.  Acute Pancreatitis Acute pancreatitis is a disease in which the pancreas becomes suddenly inflamed. The pancreas is a large gland located behind your stomach. The pancreas produces enzymes that help digest food. The pancreas also releases the hormones glucagon and insulin that help regulate blood sugar. Damage to the pancreas occurs when the digestive enzymes from the pancreas are activated and begin attacking the pancreas before being released into the intestine. Most acute attacks last a couple of days and can cause serious complications. Some people become dehydrated and develop low blood pressure. In severe cases, bleeding into the pancreas can lead to shock and can be life-threatening. The lungs, heart, and kidneys may fail. CAUSES  Pancreatitis can happen to anyone. In some cases, the cause is unknown. Most cases are caused by:  Alcohol abuse.  Gallstones. Other less common causes are:  Certain medicines.  Exposure to certain chemicals.  Infection.  Damage caused by an accident (trauma).  Abdominal surgery. SYMPTOMS   Pain in the upper abdomen that may radiate to the back.  Tenderness and swelling of the abdomen.  Nausea and vomiting. DIAGNOSIS  Your caregiver will perform a physical exam. Blood and stool tests may be done to confirm the diagnosis. Imaging tests may also be done, such as X-rays, CT scans, or an ultrasound of the abdomen. TREATMENT  Treatment usually requires a stay in the hospital. Treatment may include:  Pain medicine.  Fluid replacement through an intravenous line (IV).  Placing a tube in the stomach to remove stomach contents and control vomiting.  Not eating for 3 or 4 days. This gives your pancreas a rest, because enzymes are not being produced that can cause further damage.  Antibiotic medicines if your  condition is caused by an infection.  Surgery of the pancreas or gallbladder. HOME CARE INSTRUCTIONS   Follow the diet advised by your caregiver. This may involve avoiding alcohol and decreasing the amount of fat in your diet.  Eat smaller, more frequent meals. This reduces the amount of digestive juices the pancreas produces.  Drink enough fluids to keep your urine clear or pale yellow.  Only take over-the-counter or prescription medicines as directed by your caregiver.  Avoid drinking alcohol if it caused your condition.  Do not smoke.  Get plenty of rest.  Check your blood sugar at home as directed by your caregiver.  Keep all follow-up appointments as directed by your caregiver. SEEK MEDICAL CARE IF:   You do not recover as quickly as expected.  You develop new or worsening symptoms.  You have persistent pain, weakness, or nausea.  You recover and then have another episode of pain. SEEK IMMEDIATE MEDICAL CARE IF:   You are unable to eat or keep fluids down.  Your pain becomes severe.  You have a fever or persistent symptoms for more than 2 to 3 days.  You have a fever and your symptoms suddenly get worse.  Your skin or the white part of your eyes turn yellow (jaundice).  You develop vomiting.  You feel dizzy, or you faint.  Your blood sugar is high (over 300 mg/dL). MAKE SURE YOU:   Understand these instructions.  Will watch your condition.  Will get help right away if you are not doing well or get worse. Document Released: 07/24/2005 Document Revised: 01/23/2012 Document Reviewed: 11/02/2011 [employer] Patient Information 2015  [employer]. This information is not intended to replace advice given to you by your health care provider. Make sure you discuss any questions you have with your health care provider. Clear Liquid Diet A clear liquid diet is a short-term diet that is prescribed to provide the necessary fluid and basic energy you need when you can  have nothing else. The clear liquid diet consists of liquids or solids that will become liquid at room temperature. You should be able to see through the liquid. There are many reasons that you may be restricted to clear liquids, such as:  When you have a sudden-onset (acute) condition that occurs before or after surgery.  To help your body slowly get adjusted to food again after a long period when you were unable to have food.  Replacement of fluids when you have a diarrheal disease.  When you are going to have certain exams, such as a colonoscopy, in which instruments are inserted inside your body to look at parts of your digestive system. WHAT CAN I HAVE? A clear liquid diet does not provide all the nutrients you need. It is important to choose a variety of the following items to get as many nutrients as possible:  Vegetable juices that do not have pulp.  Fruit juices and fruit drinks that do not have pulp.  Coffee (regular or decaffeinated), tea, or soda at the discretion of your health care provider.  Clear bouillon, broth, or strained broth-based soups.  High-protein and flavored gelatins.  Sugar or honey.  Ices or frozen ice pops that do not contain milk. If you are not sure whether you can have certain items, you should ask your health care provider. You may also ask your health care provider if there are any other clear liquid options. Document Released: 07/24/2005 Document Revised: 07/29/2013 Document Reviewed: 06/20/2013 [facility] Patient Information 2015 [place], [place]. This information is not intended to replace advice given to you by your health care provider. Make sure you discuss any questions you have with your health care provider.

## 2014-10-17 NOTE — ED Notes (Signed)
Pt reports "my pancreas are hurting me and i'm sick", reports 4 days of worsening pain, has had n/v/d, denies fever.

## 2014-10-17 NOTE — ED Provider Notes (Addendum)
CSN: [id number]     Arrival date & time 10/17/14  1121 History   First MD Initiated Contact with Patient 10/17/14 1244     Chief Complaint  Patient presents with  . Abdominal Pain     (Consider location/radiation/quality/duration/timing/severity/associated sxs/prior Treatment) HPI Comments: 64 year old African American male here with complaints of abdominal pain and low back pain x 4 days. Has a 10-year history of chronic pancreatitis attacks and states that this feels like a normal attack to him. Pain is a sharp, stabbing type in the epigastric region. Vomited clear green bile multiple times with the most recent one within the past hour.  This morning he had defecated on himself while sleeping then had an episode afterwards where he had urinated on himself; this has never happened to him in the past. Most recent abdominal surgery was in December and they placed a stent in his pancreas. Denies numbness/tingling, weakness in the extremities, chest pain, SOB and lightheaded/dizziness.   Patient is a 64 y.o. male presenting with abdominal pain. The history is provided by the patient.  Abdominal Pain Associated symptoms: no chest pain, no cough, no dysuria and no shortness of breath     Past Medical History  Diagnosis Date  . Hypertension   . Pancreatitis   . Myocardial infarct   . Diabetes mellitus without complication   . Chronic back pain   . Thyroid cancer   . Stroke     multiple in sept and oct 2013   Past Surgical History  Procedure Laterality Date  . Thyroidectomy    . Pancreas surgery    . Knee surgery    . Lumbar epidural injection    . Angioplasty     No family history on file. History  Substance Use Topics  . Smoking status: Former Smoker    Quit date: 06/08/2012  . Smokeless tobacco: Never Used  . Alcohol Use: No    Review of Systems  Constitutional: Negative for activity change and appetite change.  Respiratory: Negative for cough and shortness of breath.    Cardiovascular: Negative for chest pain.  Gastrointestinal: Positive for abdominal pain.  Genitourinary: Negative for dysuria, frequency and difficulty urinating.  Musculoskeletal: Positive for back pain.      Allergies  Ivp dye and Spinach  Home Medications   Prior to Admission medications   Medication Sig Start Date End Date Taking? Authorizing Provider  ALPRAZOLAM PO Take by mouth 3 (three) times daily.    Historical Provider, MD  amLODipine (NORVASC) 10 MG tablet Take 10 mg by mouth every morning.     Historical Provider, MD  aspirin 325 MG EC tablet Take 81 mg by mouth every morning.     Historical Provider, MD  atenolol (TENORMIN) 100 MG tablet Take 100 mg by mouth every morning.     Historical Provider, MD  atorvastatin (LIPITOR) 20 MG tablet Take 20 mg by mouth at bedtime.      Historical Provider, MD  baclofen (LIORESAL) 10 MG tablet Take 1 tablet (10 mg total) by mouth 3 (three) times daily. 11/28/13   Tammi Triplett, PA-C  carbamazepine (TEGRETOL-XR) 100 MG 12 hr tablet Take 1 tablet (100 mg total) by mouth 2 (two) times daily. 05/18/13   Orpah Greek, MD  cloNIDine (CATAPRES) 0.2 MG tablet Take 0.2 mg by mouth at bedtime.      Historical Provider, MD  clopidogrel (PLAVIX) 75 MG tablet Take 75 mg by mouth every morning.  Historical Provider, MD  Diazepam (VALIUM PO) Take by mouth.    Historical Provider, MD  esomeprazole (NEXIUM) 40 MG capsule Take 40 mg by mouth daily before breakfast.      Historical Provider, MD  fentaNYL (DURAGESIC - DOSED MCG/HR) 75 MCG/HR Place 1 patch onto the skin every 3 (three) days.    Historical Provider, MD  furosemide (LASIX) 40 MG tablet Take 40 mg by mouth every morning.     Historical Provider, MD  gabapentin (NEURONTIN) 600 MG tablet Take 900 mg by mouth 3 (three) times daily as needed. Nerve pain    Historical Provider, MD  HYDROcodone-acetaminophen (NORCO/VICODIN) 5-325 MG per tablet Take 1 tablet by mouth every 6 (six) hours  as needed. 10/17/14   [name], MD  hydrocortisone 2.5 % cream Apply topically 2 (two) times daily as needed. Hemorids     Historical Provider, MD  imipramine (TOFRANIL) 25 MG tablet Take 25 mg by mouth at bedtime.      Historical Provider, MD  isosorbide mononitrate (IMDUR) 30 MG 24 hr tablet Take 15 mg by mouth every morning.     Historical Provider, MD  levothyroxine (SYNTHROID, LEVOTHROID) 300 MCG tablet Take 300 mcg by mouth daily.      Historical Provider, MD  lidocaine (LIDODERM) 5 % Place 1 patch onto the skin daily. Remove & Discard patch within 12 hours or as directed by MD    Historical Provider, MD  losartan-hydrochlorothiazide (HYZAAR) 100-25 MG per tablet Take 1 tablet by mouth every morning.     Historical Provider, MD  metFORMIN (GLUCOPHAGE) 500 MG tablet Take 500 mg by mouth 2 (two) times daily with a meal.    Historical Provider, MD  nitroGLYCERIN (NITROSTAT) 0.4 MG SL tablet Place 0.4 mg under the tongue every 5 (five) minutes x 3 doses as needed. Chest pain    Historical Provider, MD  ondansetron (ZOFRAN ODT) 8 MG disintegrating tablet Take 1 tablet (8 mg total) by mouth every 8 (eight) hours as needed for nausea. 10/17/14   [name], MD  ondansetron (ZOFRAN) 4 MG tablet Take 4 mg by mouth every 8 (eight) hours as needed for nausea.    Historical Provider, MD  ondansetron (ZOFRAN) 4 MG tablet Take 1 tablet (4 mg total) by mouth every 6 (six) hours. 09/18/13   [name], MD  Pancrelipase, Lip-Prot-Amyl, (ZENPEP) 5000 UNITS CPEP Take 3 capsules by mouth at bedtime.      Historical Provider, MD  potassium chloride (KLOR-CON) 10 MEQ CR tablet Take 10 mEq by mouth every morning.     Historical Provider, MD  promethazine (PHENERGAN) 25 MG suppository Place 1 suppository (25 mg total) rectally every 6 (six) hours as needed for nausea. 10/17/14   [name], MD  SERTRALINE HCL PO Take by mouth at bedtime.    Historical Provider, MD   BP 132/80 mmHg  Pulse 72   Temp(Src) 97.9 F (36.6 C) (Oral)  Resp 18  Ht 6\' 1"  (1.854 m)  Wt 250 lb (113.399 kg)  BMI 32.99 kg/m2  SpO2 100% Physical Exam  Constitutional: He is oriented to person, place, and time. He appears well-developed.  HENT:  Head: Normocephalic and atraumatic.  Eyes: Conjunctivae and EOM are normal. Pupils are equal, round, and reactive to light.  Neck: Normal range of motion. Neck supple.  Cardiovascular: Normal rate and regular rhythm.   Pulmonary/Chest: Effort normal and breath sounds normal.  Abdominal: Soft. Bowel sounds are normal. He exhibits no distension. There is  no tenderness. There is no rebound and no guarding.  Musculoskeletal:  Pt has tenderness over the lumbar region No step offs, no erythema. Pt has 1+ patellar reflex bilaterally. Able to discriminate between sharp and dull. Able to ambulate  Neurological: He is alert and oriented to person, place, and time.  Skin: Skin is warm.  Nursing note and vitals reviewed.   ED Course  Procedures (including critical care time) Labs Review Labs Reviewed  CBC WITH DIFFERENTIAL/PLATELET - Abnormal; Notable for the following:    Hemoglobin 12.5 (*)    HCT 38.5 (*)    MCV 72.5 (*)    MCH 23.5 (*)    RDW 16.1 (*)    All other components within normal limits  COMPREHENSIVE METABOLIC PANEL - Abnormal; Notable for the following:    Glucose, Bld 128 (*)    GFR calc non Af Amer 72 (*)    GFR calc Af Amer 84 (*)    All other components within normal limits  URINALYSIS, ROUTINE W REFLEX MICROSCOPIC - Abnormal; Notable for the following:    Hgb urine dipstick MODERATE (*)    All other components within normal limits  AMYLASE  LIPASE, BLOOD  URINE MICROSCOPIC-ADD ON    Imaging Review Dg Lumbar Spine Complete  10/17/2014   CLINICAL DATA:  lower back pain, specifically the lower lumbar region. Sts this has been ongoing for 13 years but bad enough today to have it looked it. Denies injury or surgery to area.  EXAM: LUMBAR  SPINE - COMPLETE 4+ VIEW  COMPARISON:  CT 02/19/2014  FINDINGS: Bilateral facet DJD L4-5 and L5-S1. Small anterior endplate spurs at all lumbar levels. There is mild narrowing of the interspaces at L2-3 and L3-4, moderate narrowing L4-5. Normal alignment. Bridging osteophytes in the visualized lower thoracic spine. Negative for fracture. Patchy aortic calcifications.  IMPRESSION: 1. Negative for fracture or other acute bone abnormality. 2. Multilevel degenerative changes as above   Electronically Signed   By: Lucrezia Europe M.D.   On: 10/17/2014 13:54     EKG Interpretation None       @3 : oral challenge passed, and pain free. Will d.c  MDM   Final diagnoses:  Chronic pancreatitis, unspecified pancreatitis type    Pt comes in with cc of abd pain. Pt has hx of pancreatitis - pain similar in nature, w/o peritoneal signs. Will start oral challenge if pain controlled. Labs are reassuring. Pt also has back pain and had an episode of both, fecal and urine incontinence last night. Subsequently has had normal BM and urine - back exam is benign - besides poor reflex bilaterally, and pt is ambulating. Will get screning xrays given hx of hernia - if large displacement, will need MRI.    [name], MD 10/17/14 7948  [name], MD 10/17/14 0165

## 2014-11-25 ENCOUNTER — Encounter (HOSPITAL_BASED_OUTPATIENT_CLINIC_OR_DEPARTMENT_OTHER): Payer: Self-pay

## 2014-11-25 ENCOUNTER — Emergency Department (HOSPITAL_BASED_OUTPATIENT_CLINIC_OR_DEPARTMENT_OTHER)
Admission: EM | Admit: 2014-11-25 | Discharge: 2014-11-25 | Disposition: A | Discharge status: Home / Self Care | Payer: Medicare HMO | Attending: Emergency Medicine | Admitting: Emergency Medicine

## 2014-11-25 ENCOUNTER — Emergency Department (HOSPITAL_BASED_OUTPATIENT_CLINIC_OR_DEPARTMENT_OTHER): Payer: Medicare HMO

## 2014-11-25 DIAGNOSIS — Z7982 Long term (current) use of aspirin: Secondary | ICD-10-CM | POA: Insufficient documentation

## 2014-11-25 DIAGNOSIS — Z85850 Personal history of malignant neoplasm of thyroid: Secondary | ICD-10-CM | POA: Diagnosis not present

## 2014-11-25 DIAGNOSIS — Z7901 Long term (current) use of anticoagulants: Secondary | ICD-10-CM | POA: Diagnosis not present

## 2014-11-25 DIAGNOSIS — Z9861 Coronary angioplasty status: Secondary | ICD-10-CM | POA: Diagnosis not present

## 2014-11-25 DIAGNOSIS — Z9889 Other specified postprocedural states: Secondary | ICD-10-CM | POA: Insufficient documentation

## 2014-11-25 DIAGNOSIS — I252 Old myocardial infarction: Secondary | ICD-10-CM | POA: Insufficient documentation

## 2014-11-25 DIAGNOSIS — R1012 Left upper quadrant pain: Secondary | ICD-10-CM | POA: Diagnosis present

## 2014-11-25 DIAGNOSIS — Z8719 Personal history of other diseases of the digestive system: Secondary | ICD-10-CM | POA: Diagnosis not present

## 2014-11-25 DIAGNOSIS — Z8673 Personal history of transient ischemic attack (TIA), and cerebral infarction without residual deficits: Secondary | ICD-10-CM | POA: Diagnosis not present

## 2014-11-25 DIAGNOSIS — I10 Essential (primary) hypertension: Secondary | ICD-10-CM | POA: Insufficient documentation

## 2014-11-25 DIAGNOSIS — G8929 Other chronic pain: Secondary | ICD-10-CM | POA: Diagnosis not present

## 2014-11-25 DIAGNOSIS — Z87891 Personal history of nicotine dependence: Secondary | ICD-10-CM | POA: Diagnosis not present

## 2014-11-25 DIAGNOSIS — E119 Type 2 diabetes mellitus without complications: Secondary | ICD-10-CM | POA: Diagnosis not present

## 2014-11-25 DIAGNOSIS — Z79899 Other long term (current) drug therapy: Secondary | ICD-10-CM | POA: Insufficient documentation

## 2014-11-25 LAB — URINALYSIS, ROUTINE W REFLEX MICROSCOPIC
Bilirubin Urine: NEGATIVE
Glucose, UA: NEGATIVE mg/dL
Ketones, ur: NEGATIVE mg/dL
LEUKOCYTES UA: NEGATIVE
Nitrite: NEGATIVE
PROTEIN: 30 mg/dL — AB
SPECIFIC GRAVITY, URINE: 1.017 (ref 1.005–1.030)
Urobilinogen, UA: 0.2 mg/dL (ref 0.0–1.0)
pH: 7.5 (ref 5.0–8.0)

## 2014-11-25 LAB — COMPREHENSIVE METABOLIC PANEL
ALT: 29 U/L (ref 0–53)
AST: 33 U/L (ref 0–37)
Albumin: 3.5 g/dL (ref 3.5–5.2)
Alkaline Phosphatase: 50 U/L (ref 39–117)
Anion gap: 7 (ref 5–15)
BUN: 8 mg/dL (ref 6–23)
CHLORIDE: 105 mmol/L (ref 96–112)
CO2: 28 mmol/L (ref 19–32)
Calcium: 8.3 mg/dL — ABNORMAL LOW (ref 8.4–10.5)
Creatinine, Ser: 1.06 mg/dL (ref 0.50–1.35)
GFR calc Af Amer: 85 mL/min — ABNORMAL LOW (ref >90)
GFR calc non Af Amer: 73 mL/min — ABNORMAL LOW (ref >90)
GLUCOSE: 115 mg/dL — AB (ref 70–99)
POTASSIUM: 3.9 mmol/L (ref 3.5–5.1)
Sodium: 140 mmol/L (ref 135–145)
TOTAL PROTEIN: 7.7 g/dL (ref 6.0–8.3)
Total Bilirubin: 0.5 mg/dL (ref 0.3–1.2)

## 2014-11-25 LAB — LIPASE, BLOOD: Lipase: 29 U/L (ref 11–59)

## 2014-11-25 LAB — DIFFERENTIAL
Basophils Absolute: 0.1 10*3/uL (ref 0.0–0.1)
Basophils Relative: 1 % (ref 0–1)
EOS PCT: 0 % (ref 0–5)
Eosinophils Absolute: 0 10*3/uL (ref 0.0–0.7)
LYMPHS ABS: 1.9 10*3/uL (ref 0.7–4.0)
Lymphocytes Relative: 29 % (ref 12–46)
MONO ABS: 0.6 10*3/uL (ref 0.1–1.0)
Monocytes Relative: 9 % (ref 3–12)
NEUTROS ABS: 3.8 10*3/uL (ref 1.7–7.7)
Neutrophils Relative %: 61 % (ref 43–77)

## 2014-11-25 LAB — URINE MICROSCOPIC-ADD ON

## 2014-11-25 LAB — CBC
HEMATOCRIT: 36.6 % — AB (ref 39.0–52.0)
Hemoglobin: 11.7 g/dL — ABNORMAL LOW (ref 13.0–17.0)
MCH: 23.4 pg — AB (ref 26.0–34.0)
MCHC: 32 g/dL (ref 30.0–36.0)
MCV: 73.3 fL — AB (ref 78.0–100.0)
Platelets: 245 10*3/uL (ref 150–400)
RBC: 4.99 MIL/uL (ref 4.22–5.81)
RDW: 14.5 % (ref 11.5–15.5)
WBC: 6.4 10*3/uL (ref 4.0–10.5)

## 2014-11-25 LAB — TROPONIN I

## 2014-11-25 MED ORDER — FENTANYL CITRATE (PF) 100 MCG/2ML IJ SOLN
100.0000 ug | Freq: Once | INTRAMUSCULAR | Status: AC
  Administered 2014-11-25: 100 ug via INTRAVENOUS
  Filled 2014-11-25: qty 2

## 2014-11-25 MED ORDER — HYDROMORPHONE HCL 1 MG/ML IJ SOLN
1.0000 mg | Freq: Once | INTRAMUSCULAR | Status: AC
  Administered 2014-11-25: 1 mg via INTRAVENOUS
  Filled 2014-11-25: qty 1

## 2014-11-25 MED ORDER — FENTANYL CITRATE (PF) 100 MCG/2ML IJ SOLN
100.0000 ug | Freq: Once | INTRAMUSCULAR | Status: AC
Start: 2014-11-25 — End: 2014-11-25
  Administered 2014-11-25: 100 ug via INTRAVENOUS
  Filled 2014-11-25: qty 2

## 2014-11-25 MED ORDER — HYDROCODONE-ACETAMINOPHEN 5-325 MG PO TABS: 1.0000 | ORAL_TABLET | Freq: Four times a day (QID) | ORAL | Status: DC | PRN

## 2014-11-25 MED ORDER — ONDANSETRON 8 MG PO TBDP: 4.0000 mg | ORAL_TABLET | Freq: Three times a day (TID) | ORAL | Status: AC | PRN

## 2014-11-25 MED ORDER — SODIUM CHLORIDE 0.9 % IV BOLUS (SEPSIS)
1000.0000 mL | Freq: Once | INTRAVENOUS | Status: AC
  Administered 2014-11-25: 1000 mL via INTRAVENOUS

## 2014-11-25 MED ORDER — ONDANSETRON HCL 4 MG/2ML IJ SOLN
4.0000 mg | Freq: Once | INTRAMUSCULAR | Status: AC
  Administered 2014-11-25: 4 mg via INTRAVENOUS
  Filled 2014-11-25: qty 2

## 2014-11-25 NOTE — ED Notes (Signed)
Pt reports n/v/d  Denies diarrhea. P t reports "its pancreatitis".  Pt moaning and groaning in pain. Pt reports vomited "alot".

## 2014-11-25 NOTE — ED Notes (Signed)
Pt reports understanding of d/c instructions, will F/U with his doctor in May.  Reviewed medications, pt familiar with Zofran and Vicodin and states will take as prescribed.  IV sites removed, pt tolerated well

## 2014-11-25 NOTE — ED Notes (Signed)
Unable to get urine at this point

## 2014-11-25 NOTE — ED Provider Notes (Signed)
CSN: [id number]     Arrival date & time 11/25/14  1818 History   First MD Initiated Contact with Patient 11/25/14 1827     Chief Complaint  Patient presents with  . Abdominal Pain     (Consider location/radiation/quality/duration/timing/severity/associated sxs/prior Treatment) HPI  This is a 64 year old male with a past medical history of chronic pancreatitis, diabetes, chronic back pain, hypertension who presents emergency per with chief complaint of severe left upper quadrant pain consistent with previous pancreatitis flares. Patient comes in moaning. He has had several episodes of vomiting. His pain became severe yesterday. Patient has been unable to hold down foods and fluids. His pain radiates to his back. He denies chest pain, shortness of breath, hematochezia, melena, or hematemesis. He denies a history of alcohol abuse. The patient denies fevers, chills, myalgias, or other signs of systemic infection.  Past Medical History  Diagnosis Date  . Hypertension   . Pancreatitis   . Myocardial infarct   . Diabetes mellitus without complication   . Chronic back pain   . Thyroid cancer   . Stroke     multiple in sept and oct 2013   Past Surgical History  Procedure Laterality Date  . Thyroidectomy    . Pancreas surgery    . Knee surgery    . Lumbar epidural injection    . Angioplasty     No family history on file. History  Substance Use Topics  . Smoking status: Former Smoker    Quit date: 06/08/2012  . Smokeless tobacco: Never Used  . Alcohol Use: No    Review of Systems   Ten systems reviewed and are negative for acute change, except as noted in the HPI.    Allergies  Ivp dye and Spinach  Home Medications   Prior to Admission medications   Medication Sig Start Date End Date Taking? Authorizing Provider  ALPRAZOLAM PO Take by mouth 3 (three) times daily.    Historical Provider, MD  amLODipine (NORVASC) 10 MG tablet Take 10 mg by mouth every morning.     Historical  Provider, MD  aspirin 325 MG EC tablet Take 81 mg by mouth every morning.     Historical Provider, MD  atenolol (TENORMIN) 100 MG tablet Take 100 mg by mouth every morning.     Historical Provider, MD  atorvastatin (LIPITOR) 20 MG tablet Take 20 mg by mouth at bedtime.      Historical Provider, MD  baclofen (LIORESAL) 10 MG tablet Take 1 tablet (10 mg total) by mouth 3 (three) times daily. 11/28/13   [name], PA-C  carbamazepine (TEGRETOL-XR) 100 MG 12 hr tablet Take 1 tablet (100 mg total) by mouth 2 (two) times daily. 05/18/13   Orpah Greek, MD  cloNIDine (CATAPRES) 0.2 MG tablet Take 0.2 mg by mouth at bedtime.      Historical Provider, MD  clopidogrel (PLAVIX) 75 MG tablet Take 75 mg by mouth every morning.     Historical Provider, MD  Diazepam (VALIUM PO) Take by mouth.    Historical Provider, MD  esomeprazole (NEXIUM) 40 MG capsule Take 40 mg by mouth daily before breakfast.      Historical Provider, MD  fentaNYL (DURAGESIC - DOSED MCG/HR) 75 MCG/HR Place 1 patch onto the skin every 3 (three) days.    Historical Provider, MD  furosemide (LASIX) 40 MG tablet Take 40 mg by mouth every morning.     Historical Provider, MD  gabapentin (NEURONTIN) 600 MG tablet Take 900 mg  by mouth 3 (three) times daily as needed. Nerve pain    Historical Provider, MD  HYDROcodone-acetaminophen (NORCO/VICODIN) 5-325 MG per tablet Take 1 tablet by mouth every 6 (six) hours as needed. 10/17/14   [name], MD  hydrocortisone 2.5 % cream Apply topically 2 (two) times daily as needed. Hemorids     Historical Provider, MD  imipramine (TOFRANIL) 25 MG tablet Take 25 mg by mouth at bedtime.      Historical Provider, MD  isosorbide mononitrate (IMDUR) 30 MG 24 hr tablet Take 15 mg by mouth every morning.     Historical Provider, MD  levothyroxine (SYNTHROID, LEVOTHROID) 300 MCG tablet Take 300 mcg by mouth daily.      Historical Provider, MD  lidocaine (LIDODERM) 5 % Place 1 patch onto the skin daily.  Remove & Discard patch within 12 hours or as directed by MD    Historical Provider, MD  losartan-hydrochlorothiazide (HYZAAR) 100-25 MG per tablet Take 1 tablet by mouth every morning.     Historical Provider, MD  metFORMIN (GLUCOPHAGE) 500 MG tablet Take 500 mg by mouth 2 (two) times daily with a meal.    Historical Provider, MD  nitroGLYCERIN (NITROSTAT) 0.4 MG SL tablet Place 0.4 mg under the tongue every 5 (five) minutes x 3 doses as needed. Chest pain    Historical Provider, MD  ondansetron (ZOFRAN ODT) 8 MG disintegrating tablet Take 1 tablet (8 mg total) by mouth every 8 (eight) hours as needed for nausea. 10/17/14   [name], MD  ondansetron (ZOFRAN) 4 MG tablet Take 4 mg by mouth every 8 (eight) hours as needed for nausea.    Historical Provider, MD  ondansetron (ZOFRAN) 4 MG tablet Take 1 tablet (4 mg total) by mouth every 6 (six) hours. 09/18/13   [name], MD  Pancrelipase, Lip-Prot-Amyl, (ZENPEP) 5000 UNITS CPEP Take 3 capsules by mouth at bedtime.      Historical Provider, MD  potassium chloride (KLOR-CON) 10 MEQ CR tablet Take 10 mEq by mouth every morning.     Historical Provider, MD  promethazine (PHENERGAN) 25 MG suppository Place 1 suppository (25 mg total) rectally every 6 (six) hours as needed for nausea. 10/17/14   [name], MD  SERTRALINE HCL PO Take by mouth at bedtime.    Historical Provider, MD   BP 170/97 mmHg  Pulse 84  Temp(Src) 98.7 F (37.1 C) (Oral)  Resp 18  Ht 6\' 1"  (1.854 m)  Wt 260 lb (117.935 kg)  BMI 34.31 kg/m2  SpO2 100% Physical Exam  Constitutional: He is oriented to person, place, and time. He appears well-developed and well-nourished. No distress.  Patient appears very uncomfortable. He is groaning and clutching his abdomen. Patient vomited twice during history of present illness.  HENT:  Head: Normocephalic and atraumatic.  Eyes: Conjunctivae are normal. No scleral icterus.  Neck: Normal range of motion. Neck supple.   Cardiovascular: Normal rate, regular rhythm and normal heart sounds.   Pulmonary/Chest: Effort normal and breath sounds normal. No respiratory distress.  Abdominal: Soft. Bowel sounds are normal. He exhibits no distension. There is tenderness. There is guarding. There is no rebound.  Musculoskeletal: He exhibits no edema.  Neurological: He is alert and oriented to person, place, and time.  Skin: Skin is warm and dry. He is not diaphoretic.  Psychiatric: His behavior is normal.  Nursing note and vitals reviewed.   ED Course  Procedures (including critical care time) Labs Review Labs Reviewed  URINALYSIS, [place]  MICROSCOPIC - Abnormal; Notable for the following:    Hgb urine dipstick LARGE (*)    Protein, ur 30 (*)    All other components within normal limits  CBC - Abnormal; Notable for the following:    Hemoglobin 11.7 (*)    HCT 36.6 (*)    MCV 73.3 (*)    MCH 23.4 (*)    All other components within normal limits  COMPREHENSIVE METABOLIC PANEL - Abnormal; Notable for the following:    Glucose, Bld 115 (*)    Calcium 8.3 (*)    GFR calc non Af Amer 73 (*)    GFR calc Af Amer 85 (*)    All other components within normal limits  URINE MICROSCOPIC-ADD ON  DIFFERENTIAL  LIPASE, BLOOD  TROPONIN I  CBC WITH DIFFERENTIAL/PLATELET    Imaging Review Dg Chest Portable 1 View  11/25/2014   CLINICAL DATA:  Epigastric pain, nausea, vomiting.  EXAM: PORTABLE CHEST - 1 VIEW  COMPARISON:  July 15, 2014.  FINDINGS: Stable cardiomediastinal silhouette. Both lungs are clear. No pneumothorax or pleural effusion is noted. The visualized skeletal structures are unremarkable.  IMPRESSION: No acute cardiopulmonary abnormality seen.   Electronically Signed   By: Marijo Conception, M.D.   On: 11/25/2014 19:55     EKG Interpretation   Date/Time:  Wednesday November 25 2014 18:47:16 EDT Ventricular Rate:  76 PR Interval:  138 QRS Duration: 82 QT Interval:  394 QTC Calculation: 443 R  Axis:   40 Text Interpretation:  Normal sinus rhythm Normal ECG Confirmed by Hazle Coca [phone]) on 11/25/2014 6:53:52 PM      MDM   Final diagnoses:  None    BP 170/97 mmHg  Pulse 84  Temp(Src) 98.7 F (37.1 C) (Oral)  Resp 18  Ht 6\' 1"  (1.854 m)  Wt 260 lb (117.935 kg)  BMI 34.31 kg/m2  SpO2 100% pain consistent with previous pancreatitis flares. His labs are reassuring, without elevation in his pancreatic enzymes. We'll attempt pain control and oral challenge. No peritoneal signs on examination. He did not have an acute abdomen.   Patient pain controlled in the ED. Holding down oral fluids. Labs are reassuring. Repeat abdominal exam shows no rigidity or signs of acute abdomen.  Margarita Mail, PA-C 11/30/14 1933  Quintella Reichert, MD 12/01/14 [phone]

## 2014-11-25 NOTE — Discharge Instructions (Signed)

## 2014-12-19 ENCOUNTER — Emergency Department (HOSPITAL_BASED_OUTPATIENT_CLINIC_OR_DEPARTMENT_OTHER): Payer: Medicare HMO

## 2014-12-19 ENCOUNTER — Emergency Department (HOSPITAL_COMMUNITY): Payer: Medicare HMO

## 2014-12-19 ENCOUNTER — Emergency Department (HOSPITAL_BASED_OUTPATIENT_CLINIC_OR_DEPARTMENT_OTHER)
Admission: EM | Admit: 2014-12-19 | Discharge: 2014-12-19 | Disposition: A | Discharge status: Home / Self Care | Payer: Medicare HMO | Attending: Emergency Medicine | Admitting: Emergency Medicine

## 2014-12-19 ENCOUNTER — Encounter (HOSPITAL_BASED_OUTPATIENT_CLINIC_OR_DEPARTMENT_OTHER): Payer: Self-pay | Admitting: *Deleted

## 2014-12-19 DIAGNOSIS — G8929 Other chronic pain: Secondary | ICD-10-CM | POA: Insufficient documentation

## 2014-12-19 DIAGNOSIS — E119 Type 2 diabetes mellitus without complications: Secondary | ICD-10-CM | POA: Diagnosis not present

## 2014-12-19 DIAGNOSIS — Z7902 Long term (current) use of antithrombotics/antiplatelets: Secondary | ICD-10-CM | POA: Insufficient documentation

## 2014-12-19 DIAGNOSIS — R110 Nausea: Secondary | ICD-10-CM | POA: Insufficient documentation

## 2014-12-19 DIAGNOSIS — Z7982 Long term (current) use of aspirin: Secondary | ICD-10-CM | POA: Diagnosis not present

## 2014-12-19 DIAGNOSIS — Z79899 Other long term (current) drug therapy: Secondary | ICD-10-CM | POA: Insufficient documentation

## 2014-12-19 DIAGNOSIS — Z87891 Personal history of nicotine dependence: Secondary | ICD-10-CM | POA: Diagnosis not present

## 2014-12-19 DIAGNOSIS — R1084 Generalized abdominal pain: Secondary | ICD-10-CM | POA: Diagnosis present

## 2014-12-19 DIAGNOSIS — R197 Diarrhea, unspecified: Secondary | ICD-10-CM | POA: Diagnosis not present

## 2014-12-19 DIAGNOSIS — I10 Essential (primary) hypertension: Secondary | ICD-10-CM | POA: Diagnosis not present

## 2014-12-19 DIAGNOSIS — K5900 Constipation, unspecified: Secondary | ICD-10-CM | POA: Insufficient documentation

## 2014-12-19 DIAGNOSIS — Z9861 Coronary angioplasty status: Secondary | ICD-10-CM | POA: Diagnosis not present

## 2014-12-19 DIAGNOSIS — Z85850 Personal history of malignant neoplasm of thyroid: Secondary | ICD-10-CM | POA: Diagnosis not present

## 2014-12-19 DIAGNOSIS — R109 Unspecified abdominal pain: Secondary | ICD-10-CM

## 2014-12-19 DIAGNOSIS — I252 Old myocardial infarction: Secondary | ICD-10-CM | POA: Insufficient documentation

## 2014-12-19 DIAGNOSIS — Z8673 Personal history of transient ischemic attack (TIA), and cerebral infarction without residual deficits: Secondary | ICD-10-CM | POA: Diagnosis not present

## 2014-12-19 LAB — CBC WITH DIFFERENTIAL/PLATELET
BASOS ABS: 0.1 10*3/uL (ref 0.0–0.1)
Basophils Relative: 1 % (ref 0–1)
EOS PCT: 1 % (ref 0–5)
Eosinophils Absolute: 0 10*3/uL (ref 0.0–0.7)
HEMATOCRIT: 38.3 % — AB (ref 39.0–52.0)
Hemoglobin: 12.5 g/dL — ABNORMAL LOW (ref 13.0–17.0)
Lymphocytes Relative: 32 % (ref 12–46)
Lymphs Abs: 1.5 10*3/uL (ref 0.7–4.0)
MCH: 23.7 pg — ABNORMAL LOW (ref 26.0–34.0)
MCHC: 32.6 g/dL (ref 30.0–36.0)
MCV: 72.5 fL — AB (ref 78.0–100.0)
MONO ABS: 0.4 10*3/uL (ref 0.1–1.0)
MONOS PCT: 9 % (ref 3–12)
NEUTROS ABS: 2.8 10*3/uL (ref 1.7–7.7)
Neutrophils Relative %: 58 % (ref 43–77)
PLATELETS: 243 10*3/uL (ref 150–400)
RBC: 5.28 MIL/uL (ref 4.22–5.81)
RDW: 14.5 % (ref 11.5–15.5)
WBC: 4.8 10*3/uL (ref 4.0–10.5)

## 2014-12-19 LAB — URINALYSIS, ROUTINE W REFLEX MICROSCOPIC
Bilirubin Urine: NEGATIVE
Glucose, UA: NEGATIVE mg/dL
Ketones, ur: NEGATIVE mg/dL
LEUKOCYTES UA: NEGATIVE
Nitrite: NEGATIVE
PROTEIN: NEGATIVE mg/dL
SPECIFIC GRAVITY, URINE: 1.01 (ref 1.005–1.030)
Urobilinogen, UA: 0.2 mg/dL (ref 0.0–1.0)
pH: 7.5 (ref 5.0–8.0)

## 2014-12-19 LAB — COMPREHENSIVE METABOLIC PANEL
ALBUMIN: 4.1 g/dL (ref 3.5–5.0)
ALT: 23 U/L (ref 17–63)
AST: 31 U/L (ref 15–41)
Alkaline Phosphatase: 66 U/L (ref 38–126)
Anion gap: 10 (ref 5–15)
BUN: 9 mg/dL (ref 6–20)
CO2: 28 mmol/L (ref 22–32)
CREATININE: 1.06 mg/dL (ref 0.61–1.24)
Calcium: 8.7 mg/dL — ABNORMAL LOW (ref 8.9–10.3)
Chloride: 102 mmol/L (ref 101–111)
GFR calc non Af Amer: >60 mL/min (ref >60)
GLUCOSE: 132 mg/dL — AB (ref 65–99)
POTASSIUM: 3.7 mmol/L (ref 3.5–5.1)
Sodium: 140 mmol/L (ref 135–145)
TOTAL PROTEIN: 8 g/dL (ref 6.5–8.1)
Total Bilirubin: 0.4 mg/dL (ref 0.3–1.2)

## 2014-12-19 LAB — LIPASE, BLOOD: LIPASE: 30 U/L (ref 22–51)

## 2014-12-19 LAB — I-STAT CG4 LACTIC ACID, ED: LACTIC ACID, VENOUS: 1.65 mmol/L (ref 0.5–2.0)

## 2014-12-19 LAB — URINE MICROSCOPIC-ADD ON

## 2014-12-19 MED ORDER — HYDROMORPHONE HCL 1 MG/ML IJ SOLN
1.0000 mg | Freq: Once | INTRAMUSCULAR | Status: AC
  Administered 2014-12-19: 1 mg via INTRAVENOUS
  Filled 2014-12-19: qty 1

## 2014-12-19 MED ORDER — ONDANSETRON HCL 4 MG/2ML IJ SOLN
4.0000 mg | Freq: Once | INTRAMUSCULAR | Status: AC
  Administered 2014-12-19: 4 mg via INTRAVENOUS
  Filled 2014-12-19: qty 2

## 2014-12-19 MED ORDER — SODIUM CHLORIDE 0.9 % IV BOLUS (SEPSIS)
1000.0000 mL | Freq: Once | INTRAVENOUS | Status: AC
  Administered 2014-12-19: 1000 mL via INTRAVENOUS

## 2014-12-19 MED ORDER — OXYCODONE-ACETAMINOPHEN 5-325 MG PO TABS: 1.0000 | ORAL_TABLET | Freq: Four times a day (QID) | ORAL | Status: DC | PRN

## 2014-12-19 MED ORDER — BISACODYL 5 MG PO TBEC: 5.0000 mg | DELAYED_RELEASE_TABLET | Freq: Every day | ORAL | Status: AC

## 2014-12-19 NOTE — ED Notes (Signed)
Patient transported to X-ray via stretcher per tech. 

## 2014-12-19 NOTE — ED Provider Notes (Signed)
CSN: [id number]     Arrival date & time 12/19/14  0840 History   First MD Initiated Contact with Patient 12/19/14 [phone]     Chief Complaint  Patient presents with  . Abdominal Pain     (Consider location/radiation/quality/duration/timing/severity/associated sxs/prior Treatment) HPI Comments: presents with generalized severe abdominal pain, nausea and vomiting since last night.  He has been using his home fentanyl patches and Zofran without relief.  He is a long history of chronic pancreatitis, which per patient and in chart review normal.  He presents as left upper quadrant pain.  Patient states that this is different because it is generalized pain.  He feels distended and pressure on his abdomen.  He denies fevers or chills.  He has had for loose stools since this morning, he has not seen blood in his stool or his urine or had any difficulty urinating.  Emesis is clear, nonbloody and nonbilious.  He has had approximately 10-15 episodes of this.   Patient is a 64 y.o. male presenting with abdominal pain.  Abdominal Pain Associated symptoms: diarrhea, nausea and vomiting   Associated symptoms: no chest pain, no constipation, no cough, no dysuria, no fatigue, no fever and no shortness of breath     Past Medical History  Diagnosis Date  . Hypertension   . Pancreatitis   . Myocardial infarct   . Diabetes mellitus without complication   . Chronic back pain   . Thyroid cancer   . Stroke     multiple in sept and oct 2013   Past Surgical History  Procedure Laterality Date  . Thyroidectomy    . Pancreas surgery    . Knee surgery    . Lumbar epidural injection    . Angioplasty     No family history on file. History  Substance Use Topics  . Smoking status: Former Smoker    Quit date: 06/08/2012  . Smokeless tobacco: Never Used  . Alcohol Use: No    Review of Systems  Constitutional: Negative for fever, activity change, appetite change and fatigue.  HENT: Negative for congestion,  facial swelling, rhinorrhea and trouble swallowing.   Eyes: Negative for photophobia and pain.  Respiratory: Negative for cough, chest tightness and shortness of breath.   Cardiovascular: Negative for chest pain and leg swelling.  Gastrointestinal: Positive for nausea, vomiting, abdominal pain, diarrhea and abdominal distention. Negative for constipation.  Endocrine: Negative for polydipsia and polyuria.  Genitourinary: Negative for dysuria, urgency, decreased urine volume and difficulty urinating.  Musculoskeletal: Negative for back pain and gait problem.  Skin: Negative for color change, rash and wound.  Allergic/Immunologic: Negative for immunocompromised state.  Neurological: Negative for dizziness, facial asymmetry, speech difficulty, weakness, numbness and headaches.  Psychiatric/Behavioral: Negative for confusion, decreased concentration and agitation.      Allergies  Ivp dye and Spinach  Home Medications   Prior to Admission medications   Medication Sig Start Date End Date Taking? Authorizing Provider  ALPRAZOLAM PO Take by mouth 3 (three) times daily.    Historical Provider, MD  amLODipine (NORVASC) 10 MG tablet Take 10 mg by mouth every morning.     Historical Provider, MD  aspirin 325 MG EC tablet Take 81 mg by mouth every morning.     Historical Provider, MD  atenolol (TENORMIN) 100 MG tablet Take 100 mg by mouth every morning.     Historical Provider, MD  atorvastatin (LIPITOR) 20 MG tablet Take 20 mg by mouth at bedtime.  Historical Provider, MD  baclofen (LIORESAL) 10 MG tablet Take 1 tablet (10 mg total) by mouth 3 (three) times daily. 11/28/13   [name], PA-C  bisacodyl (DULCOLAX) 5 MG EC tablet Take 1 tablet (5 mg total) by mouth daily. 12/19/14   [name], MD  carbamazepine (TEGRETOL-XR) 100 MG 12 hr tablet Take 1 tablet (100 mg total) by mouth 2 (two) times daily. 05/18/13   Orpah Greek, MD  cloNIDine (CATAPRES) 0.2 MG tablet Take 0.2 mg by  mouth at bedtime.      Historical Provider, MD  clopidogrel (PLAVIX) 75 MG tablet Take 75 mg by mouth every morning.     Historical Provider, MD  Diazepam (VALIUM PO) Take by mouth.    Historical Provider, MD  esomeprazole (NEXIUM) 40 MG capsule Take 40 mg by mouth daily before breakfast.      Historical Provider, MD  fentaNYL (DURAGESIC - DOSED MCG/HR) 75 MCG/HR Place 1 patch onto the skin every 3 (three) days.    Historical Provider, MD  furosemide (LASIX) 40 MG tablet Take 40 mg by mouth every morning.     Historical Provider, MD  gabapentin (NEURONTIN) 600 MG tablet Take 900 mg by mouth 3 (three) times daily as needed. Nerve pain    Historical Provider, MD  HYDROcodone-acetaminophen (NORCO/VICODIN) 5-325 MG per tablet Take 1 tablet by mouth every 6 (six) hours as needed. 11/25/14   Margarita Mail, PA-C  hydrocortisone 2.5 % cream Apply topically 2 (two) times daily as needed. Hemorids     Historical Provider, MD  imipramine (TOFRANIL) 25 MG tablet Take 25 mg by mouth at bedtime.      Historical Provider, MD  isosorbide mononitrate (IMDUR) 30 MG 24 hr tablet Take 15 mg by mouth every morning.     Historical Provider, MD  levothyroxine (SYNTHROID, LEVOTHROID) 300 MCG tablet Take 300 mcg by mouth daily.      Historical Provider, MD  lidocaine (LIDODERM) 5 % Place 1 patch onto the skin daily. Remove & Discard patch within 12 hours or as directed by MD    Historical Provider, MD  losartan-hydrochlorothiazide (HYZAAR) 100-25 MG per tablet Take 1 tablet by mouth every morning.     Historical Provider, MD  metFORMIN (GLUCOPHAGE) 500 MG tablet Take 500 mg by mouth 2 (two) times daily with a meal.    Historical Provider, MD  nitroGLYCERIN (NITROSTAT) 0.4 MG SL tablet Place 0.4 mg under the tongue every 5 (five) minutes x 3 doses as needed. Chest pain    Historical Provider, MD  ondansetron (ZOFRAN) 4 MG tablet Take 4 mg by mouth every 8 (eight) hours as needed for nausea.    Historical Provider, MD   ondansetron (ZOFRAN) 4 MG tablet Take 1 tablet (4 mg total) by mouth every 6 (six) hours. 09/18/13   [name], MD  ondansetron (ZOFRAN-ODT) 8 MG disintegrating tablet Take 0.5-1 tablets (4-8 mg total) by mouth every 8 (eight) hours as needed for nausea or vomiting. 4mg  ODT q4 hours prn nausea/vomit 11/25/14   Margarita Mail, PA-C  oxyCODONE-acetaminophen (PERCOCET) 5-325 MG per tablet Take 1 tablet by mouth every 6 (six) hours as needed. Use sparingly as can increase constipation 12/19/14   [name], MD  Pancrelipase, Lip-Prot-Amyl, (ZENPEP) 5000 UNITS CPEP Take 3 capsules by mouth at bedtime.      Historical Provider, MD  potassium chloride (KLOR-CON) 10 MEQ CR tablet Take 10 mEq by mouth every morning.     Historical Provider, MD  promethazine (  PHENERGAN) 25 MG suppository Place 1 suppository (25 mg total) rectally every 6 (six) hours as needed for nausea. 10/17/14   [name], MD  SERTRALINE HCL PO Take by mouth at bedtime.    Historical Provider, MD   BP 146/82 mmHg  Pulse 64  Temp(Src) 98.3 F (36.8 C) (Oral)  Resp 18  Ht 6\' 1"  (1.854 m)  Wt 244 lb (110.678 kg)  BMI 32.20 kg/m2  SpO2 98% Physical Exam  Constitutional: He is oriented to person, place, and time. He appears well-developed and well-nourished. No distress.  HENT:  Head: Normocephalic and atraumatic.  Mouth/Throat: No oropharyngeal exudate.  Eyes: Pupils are equal, round, and reactive to light.  Neck: Normal range of motion. Neck supple.  Cardiovascular: Normal rate, regular rhythm and normal heart sounds.  Exam reveals no gallop and no friction rub.   No murmur heard. Pulmonary/Chest: Effort normal and breath sounds normal. No respiratory distress. He has no wheezes. He has no rales.  Abdominal: Soft. He exhibits no distension and no mass. Bowel sounds are decreased. There is generalized tenderness. There is no rigidity, no rebound and no guarding.  Abdomen is obese.  I cannot appreciate distention.  A  fentanyl patch is in place  Musculoskeletal: Normal range of motion. He exhibits no edema or tenderness.  Neurological: He is alert and oriented to person, place, and time.  Skin: Skin is warm and dry.  Psychiatric: He has a normal mood and affect.    ED Course  Procedures (including critical care time) Labs Review Labs Reviewed  CBC WITH DIFFERENTIAL/PLATELET - Abnormal; Notable for the following:    Hemoglobin 12.5 (*)    HCT 38.3 (*)    MCV 72.5 (*)    MCH 23.7 (*)    All other components within normal limits  COMPREHENSIVE METABOLIC PANEL - Abnormal; Notable for the following:    Glucose, Bld 132 (*)    Calcium 8.7 (*)    All other components within normal limits  URINALYSIS, ROUTINE W REFLEX MICROSCOPIC - Abnormal; Notable for the following:    Hgb urine dipstick MODERATE (*)    All other components within normal limits  LIPASE, BLOOD  URINE MICROSCOPIC-ADD ON  I-STAT CG4 LACTIC ACID, ED    Imaging Review Dg Abd Acute W/chest  12/19/2014   CLINICAL DATA:  Generalized abdominal pain, nausea and vomiting. History of pancreatitis.  EXAM: DG ABDOMEN ACUTE W/ 1V CHEST  COMPARISON:  11/25/2014; 07/15/2014; 02/19/2014  FINDINGS: Grossly unchanged cardiac silhouette and mediastinal contours. Improved inspiratory effort with minimal bibasilar heterogeneous opacities, left greater than right, likely atelectasis. No new focal airspace opacities. No pleural effusion or pneumothorax.  Moderate colonic stool burden without evidence of enteric obstruction. No pneumoperitoneum, pneumatosis or portal venous gas.  Multiple phleboliths overlie the lower pelvis bilaterally. No definite abnormal intra-abdominal calcifications.  Mild scoliotic curvature of the thoracolumbar spine with associated mild to moderate multilevel DDD. Stigmata of DISH within the thoracic spine.  IMPRESSION: 1. Moderate colonic stool burden without evidence of enteric obstruction. 2. Minimal bibasilar atelectasis without acute  cardiopulmonary disease.   Electronically Signed   By: Sandi Mariscal M.D.   On: 12/19/2014 10:16     EKG Interpretation None      MDM   Final diagnoses:  Constipation, unspecified constipation type    Pt is a 64 y.o. male with Pmhx as above who presents with generalized severe abdominal pain, nausea and vomiting since last night.  He has been using his home  fentanyl patches and Zofran without relief.  He is a long history of chronic pancreatitis, which per patient and in chart review normal.  He presents as left upper quadrant pain.  Patient states that this is different because it is generalized pain.  He feels distended and pressure on his abdomen.  He denies fevers or chills.  He has had several loose stools since this morning, he has not seen blood in his stool or his urine or had any difficulty urinating.  Emesis is clear, nonbloody and nonbilious.  On physical exam, patient is uncomfortable but in no acute distress.  He has decreased bowel sounds, Generalized abdominal tenderness without rebound or guarding.  We'll start with CBC, CMP, lipase, lactic acid, and acute abdominal series will treat symptomatically, With IV fluids, IV narcotics and antibiotics.   Patient's abdominal pain is resolved after 1 dose of IV Dilaudid.  CBC, CMP, lipase are grossly unremarkable.  Urine is normal except for a chronic microscopic hematuria.  Acute abdominal series shows moderate stool burden with nonobstructive bowel gas pattern, patient has been able to tolerate liquids.  I suspect that he has some degree of constipation leading to his distention and pain.  We discharged home with a refill for his Dulcolax, which she has run out of and short course of Percocet to use sparingly for breakthrough pain.  Rest and follow-up with his PCP.    Wynell Balloon evaluation in the Emergency Department is complete. It has been determined that no acute conditions requiring further emergency intervention are present at this  time. The patient/guardian have been advised of the diagnosis and plan. We have discussed signs and symptoms that warrant return to the ED, such as changes or worsening in symptoms, worsening pain, fever, inability to tolerate liquids.       [name], MD 12/19/14 [phone]

## 2014-12-19 NOTE — ED Notes (Signed)
Pt requesting more pain and nausea meds, md notified.

## 2014-12-19 NOTE — ED Notes (Signed)
MD at bedside. 

## 2014-12-19 NOTE — ED Notes (Signed)
Pt ambulatory to restroom no difficulty.

## 2014-12-19 NOTE — ED Notes (Signed)
Patient c/o mid abd,pain, nausea/vomiting since last night, took zofran, but no relief

## 2014-12-19 NOTE — Discharge Instructions (Signed)
Constipation  Constipation is when a person has fewer than three bowel movements a week, has difficulty having a bowel movement, or has stools that are dry, hard, or larger than normal. As people grow older, constipation is more common. If you try to fix constipation with medicines that make you have a bowel movement (laxatives), the problem may get worse. Long-term laxative use may cause the muscles of the colon to become weak. A low-fiber diet, not taking in enough fluids, and taking certain medicines may make constipation worse.   CAUSES    Certain medicines, such as antidepressants, pain medicine, iron supplements, antacids, and water pills.    Certain diseases, such as diabetes, irritable bowel syndrome (IBS), thyroid disease, or depression.    Not drinking enough water.    Not eating enough fiber-rich foods.    Stress or travel.    Lack of physical activity or exercise.    Ignoring the urge to have a bowel movement.    Using laxatives too much.   SIGNS AND SYMPTOMS    Having fewer than three bowel movements a week.    Straining to have a bowel movement.    Having stools that are hard, dry, or larger than normal.    Feeling full or bloated.    Pain in the lower abdomen.    Not feeling relief after having a bowel movement.   DIAGNOSIS   Your health care provider will take a medical history and perform a physical exam. Further testing may be done for severe constipation. Some tests may include:   A barium enema X-ray to examine your rectum, colon, and, sometimes, your small intestine.    A sigmoidoscopy to examine your lower colon.    A colonoscopy to examine your entire colon.  TREATMENT   Treatment will depend on the severity of your constipation and what is causing it. Some dietary treatments include drinking more fluids and eating more fiber-rich foods. Lifestyle treatments may include regular exercise. If these diet and lifestyle recommendations do not help, your health care  provider may recommend taking over-the-counter laxative medicines to help you have bowel movements. Prescription medicines may be prescribed if over-the-counter medicines do not work.   HOME CARE INSTRUCTIONS    Eat foods that have a lot of fiber, such as fruits, vegetables, whole grains, and beans.   Limit foods high in fat and processed sugars, such as french fries, hamburgers, cookies, candies, and soda.    A fiber supplement may be added to your diet if you cannot get enough fiber from foods.    Drink enough fluids to keep your urine clear or pale yellow.    Exercise regularly or as directed by your health care provider.    Go to the restroom when you have the urge to go. Do not hold it.    Only take over-the-counter or prescription medicines as directed by your health care provider. Do not take other medicines for constipation without talking to your health care provider first.   SEEK IMMEDIATE MEDICAL CARE IF:    You have bright red blood in your stool.    Your constipation lasts for more than 4 days or gets worse.    You have abdominal or rectal pain.    You have thin, pencil-like stools.    You have unexplained weight loss.  MAKE SURE YOU:    Understand these instructions.   Will watch your condition.   Will get help right away if you are not   doing well or get worse.  Document Released: 04/21/2004 Document Revised: 07/29/2013 Document Reviewed: 05/05/2013  Exit[employer] Patient Information 2015 [employer]. This information is not intended to replace advice given to you by your health care provider. Make sure you discuss any questions you have with your health care provider.  Abdominal Pain  Many things can cause abdominal pain. Usually, abdominal pain is not caused by a disease and will improve without treatment. It can often be observed and treated at home. Your health care provider will do a physical exam and possibly order blood tests and X-rays to help determine the seriousness  of your pain. However, in many cases, more time must pass before a clear cause of the pain can be found. Before that point, your health care provider may not know if you need more testing or further treatment.  HOME CARE INSTRUCTIONS   Monitor your abdominal pain for any changes. The following actions may help to alleviate any discomfort you are experiencing:   Only take over-the-counter or prescription medicines as directed by your health care provider.   Do not take laxatives unless directed to do so by your health care provider.   Try a clear liquid diet (broth, tea, or water) as directed by your health care provider. Slowly move to a bland diet as tolerated.  SEEK MEDICAL CARE IF:   You have unexplained abdominal pain.   You have abdominal pain associated with nausea or diarrhea.   You have pain when you urinate or have a bowel movement.   You experience abdominal pain that wakes you in the night.   You have abdominal pain that is worsened or improved by eating food.   You have abdominal pain that is worsened with eating fatty foods.   You have a fever.  SEEK IMMEDIATE MEDICAL CARE IF:    Your pain does not go away within 2 hours.   You keep throwing up (vomiting).   Your pain is felt only in portions of the abdomen, such as the right side or the left lower portion of the abdomen.   You pass bloody or black tarry stools.  MAKE SURE YOU:   Understand these instructions.    Will watch your condition.    Will get help right away if you are not doing well or get worse.   Document Released: 05/03/2005 Document Revised: 07/29/2013 Document Reviewed: 04/02/2013  [employer] Patient Information 2015 [employer]. This information is not intended to replace advice given to you by your health care provider. Make sure you discuss any questions you have with your health care provider.

## 2015-01-21 ENCOUNTER — Encounter: Payer: Self-pay | Admitting: Radiation Oncology

## 2015-01-25 ENCOUNTER — Encounter: Payer: Self-pay | Admitting: Radiation Oncology

## 2015-02-01 NOTE — Progress Notes (Signed)
ERRIR

## 2015-04-14 ENCOUNTER — Emergency Department (HOSPITAL_BASED_OUTPATIENT_CLINIC_OR_DEPARTMENT_OTHER)
Admission: EM | Admit: 2015-04-14 | Discharge: 2015-04-14 | Disposition: A | Discharge status: Home / Self Care | Payer: Medicare HMO | Attending: Emergency Medicine | Admitting: Emergency Medicine

## 2015-04-14 ENCOUNTER — Encounter (HOSPITAL_BASED_OUTPATIENT_CLINIC_OR_DEPARTMENT_OTHER): Payer: Self-pay

## 2015-04-14 DIAGNOSIS — Z79899 Other long term (current) drug therapy: Secondary | ICD-10-CM | POA: Diagnosis not present

## 2015-04-14 DIAGNOSIS — I252 Old myocardial infarction: Secondary | ICD-10-CM | POA: Diagnosis not present

## 2015-04-14 DIAGNOSIS — Z7982 Long term (current) use of aspirin: Secondary | ICD-10-CM | POA: Diagnosis not present

## 2015-04-14 DIAGNOSIS — M545 Low back pain, unspecified: Secondary | ICD-10-CM

## 2015-04-14 DIAGNOSIS — Z8673 Personal history of transient ischemic attack (TIA), and cerebral infarction without residual deficits: Secondary | ICD-10-CM | POA: Diagnosis not present

## 2015-04-14 DIAGNOSIS — G8929 Other chronic pain: Secondary | ICD-10-CM | POA: Diagnosis not present

## 2015-04-14 DIAGNOSIS — Z87891 Personal history of nicotine dependence: Secondary | ICD-10-CM | POA: Diagnosis not present

## 2015-04-14 DIAGNOSIS — I10 Essential (primary) hypertension: Secondary | ICD-10-CM | POA: Diagnosis not present

## 2015-04-14 DIAGNOSIS — Z85850 Personal history of malignant neoplasm of thyroid: Secondary | ICD-10-CM | POA: Insufficient documentation

## 2015-04-14 DIAGNOSIS — E119 Type 2 diabetes mellitus without complications: Secondary | ICD-10-CM | POA: Diagnosis not present

## 2015-04-14 DIAGNOSIS — R531 Weakness: Secondary | ICD-10-CM | POA: Insufficient documentation

## 2015-04-14 DIAGNOSIS — Z8719 Personal history of other diseases of the digestive system: Secondary | ICD-10-CM | POA: Diagnosis not present

## 2015-04-14 DIAGNOSIS — M5431 Sciatica, right side: Secondary | ICD-10-CM

## 2015-04-14 MED ORDER — HYDROCODONE-ACETAMINOPHEN 5-325 MG PO TABS: 1.0000 | ORAL_TABLET | Freq: Four times a day (QID) | ORAL | Status: DC | PRN

## 2015-04-14 MED ORDER — HYDROMORPHONE HCL 1 MG/ML IJ SOLN
INTRAMUSCULAR | Status: AC
  Administered 2015-04-14: 2 mg
  Filled 2015-04-14: qty 2

## 2015-04-14 MED ORDER — INSULIN ASPART 100 UNIT/ML ~~LOC~~ SOLN: 10.0000 [IU] | Freq: Once | SUBCUTANEOUS | Status: DC

## 2015-04-14 MED ORDER — HYDROMORPHONE HCL 2 MG/ML IJ SOLN
2.0000 mg | Freq: Once | INTRAMUSCULAR | Status: AC
  Filled 2015-04-14: qty 1

## 2015-04-14 NOTE — ED Provider Notes (Signed)
CSN: [id number]     Arrival date & time 04/14/15  1109 History   First MD Initiated Contact with Patient 04/14/15 1125     Chief Complaint  Patient presents with  . Back Pain     (Consider location/radiation/quality/duration/timing/severity/associated sxs/prior Treatment) Patient is a 64 y.o. male presenting with back pain. The history is provided by the patient.  Back Pain Associated symptoms: weakness   Associated symptoms: no abdominal pain, no chest pain, no dysuria, no fever, no headaches and no numbness    patient with long-standing history of back problems. Has a primary care doctor. Patient within exacerbation of the lumbar back pain about a month ago now radiating in to the back of the right leg recently started dragging the right leg. Patient states that this is new. Had an MRI 2 months ago surgery was not recommended. Patient denies any numbness to the top or bottom of the foot.  Past Medical History  Diagnosis Date  . Hypertension   . Pancreatitis   . Myocardial infarct   . Diabetes mellitus without complication   . Chronic back pain   . Thyroid cancer   . Stroke     multiple in sept and oct 2013   Past Surgical History  Procedure Laterality Date  . Thyroidectomy    . Pancreas surgery    . Knee surgery    . Lumbar epidural injection    . Angioplasty     No family history on file. Social History  Substance Use Topics  . Smoking status: Former Smoker    Quit date: 06/08/2012  . Smokeless tobacco: Never Used  . Alcohol Use: No    Review of Systems  Constitutional: Negative for fever.  HENT: Negative for congestion.   Eyes: Negative for redness.  Respiratory: Negative for shortness of breath.   Cardiovascular: Negative for chest pain.  Gastrointestinal: Negative for abdominal pain.  Genitourinary: Negative for dysuria.  Musculoskeletal: Positive for back pain.  Skin: Negative for rash.  Neurological: Positive for weakness. Negative for numbness and  headaches.  Hematological: Does not bruise/bleed easily.  Psychiatric/Behavioral: Negative for confusion.      Allergies  Ivp dye and Spinach  Home Medications   Prior to Admission medications   Medication Sig Start Date End Date Taking? Authorizing Provider  ALPRAZOLAM PO Take by mouth 3 (three) times daily.    Historical Provider, MD  amLODipine (NORVASC) 10 MG tablet Take 10 mg by mouth every morning.     Historical Provider, MD  aspirin 325 MG EC tablet Take 81 mg by mouth every morning.     Historical Provider, MD  atenolol (TENORMIN) 100 MG tablet Take 100 mg by mouth every morning.     Historical Provider, MD  atorvastatin (LIPITOR) 20 MG tablet Take 20 mg by mouth at bedtime.      Historical Provider, MD  baclofen (LIORESAL) 10 MG tablet Take 1 tablet (10 mg total) by mouth 3 (three) times daily. 11/28/13   [name], PA-C  bisacodyl (DULCOLAX) 5 MG EC tablet Take 1 tablet (5 mg total) by mouth daily. 12/19/14   [name], MD  carbamazepine (TEGRETOL-XR) 100 MG 12 hr tablet Take 1 tablet (100 mg total) by mouth 2 (two) times daily. 05/18/13   Orpah Greek, MD  cloNIDine (CATAPRES) 0.2 MG tablet Take 0.2 mg by mouth at bedtime.      Historical Provider, MD  clopidogrel (PLAVIX) 75 MG tablet Take 75 mg by mouth every morning.  Historical Provider, MD  Diazepam (VALIUM PO) Take by mouth.    Historical Provider, MD  esomeprazole (NEXIUM) 40 MG capsule Take 40 mg by mouth daily before breakfast.      Historical Provider, MD  fentaNYL (DURAGESIC - DOSED MCG/HR) 75 MCG/HR Place 1 patch onto the skin every 3 (three) days.    Historical Provider, MD  furosemide (LASIX) 40 MG tablet Take 40 mg by mouth every morning.     Historical Provider, MD  gabapentin (NEURONTIN) 600 MG tablet Take 900 mg by mouth 3 (three) times daily as needed. Nerve pain    Historical Provider, MD  HYDROcodone-acetaminophen (NORCO/VICODIN) 5-325 MG per tablet Take 1 tablet by mouth every 6 (six)  hours as needed. 11/25/14   Margarita Mail, PA-C  HYDROcodone-acetaminophen (NORCO/VICODIN) 5-325 MG per tablet Take 1-2 tablets by mouth every 6 (six) hours as needed for moderate pain. 04/14/15   Fredia Sorrow, MD  hydrocortisone 2.5 % cream Apply topically 2 (two) times daily as needed. Hemorids     Historical Provider, MD  imipramine (TOFRANIL) 25 MG tablet Take 25 mg by mouth at bedtime.      Historical Provider, MD  isosorbide mononitrate (IMDUR) 30 MG 24 hr tablet Take 15 mg by mouth every morning.     Historical Provider, MD  levothyroxine (SYNTHROID, LEVOTHROID) 300 MCG tablet Take 300 mcg by mouth daily.      Historical Provider, MD  lidocaine (LIDODERM) 5 % Place 1 patch onto the skin daily. Remove & Discard patch within 12 hours or as directed by MD    Historical Provider, MD  losartan-hydrochlorothiazide (HYZAAR) 100-25 MG per tablet Take 1 tablet by mouth every morning.     Historical Provider, MD  metFORMIN (GLUCOPHAGE) 500 MG tablet Take 500 mg by mouth 2 (two) times daily with a meal.    Historical Provider, MD  nitroGLYCERIN (NITROSTAT) 0.4 MG SL tablet Place 0.4 mg under the tongue every 5 (five) minutes x 3 doses as needed. Chest pain    Historical Provider, MD  ondansetron (ZOFRAN) 4 MG tablet Take 4 mg by mouth every 8 (eight) hours as needed for nausea.    Historical Provider, MD  ondansetron (ZOFRAN) 4 MG tablet Take 1 tablet (4 mg total) by mouth every 6 (six) hours. 09/18/13   [name], MD  ondansetron (ZOFRAN-ODT) 8 MG disintegrating tablet Take 0.5-1 tablets (4-8 mg total) by mouth every 8 (eight) hours as needed for nausea or vomiting. 4mg  ODT q4 hours prn nausea/vomit 11/25/14   Margarita Mail, PA-C  oxyCODONE-acetaminophen (PERCOCET) 5-325 MG per tablet Take 1 tablet by mouth every 6 (six) hours as needed. Use sparingly as can increase constipation 12/19/14   [name], MD  Pancrelipase, Lip-Prot-Amyl, (ZENPEP) 5000 UNITS CPEP Take 3 capsules by mouth at bedtime.       Historical Provider, MD  potassium chloride (KLOR-CON) 10 MEQ CR tablet Take 10 mEq by mouth every morning.     Historical Provider, MD  promethazine (PHENERGAN) 25 MG suppository Place 1 suppository (25 mg total) rectally every 6 (six) hours as needed for nausea. 10/17/14   [name], MD  SERTRALINE HCL PO Take by mouth at bedtime.    Historical Provider, MD   BP 138/87 mmHg  Pulse 72  Temp(Src) 98.6 F (37 C) (Oral)  Ht 6\' 1"  (1.854 m)  Wt 250 lb (113.399 kg)  BMI 32.99 kg/m2  SpO2 99% Physical Exam  Constitutional: He is oriented to person, place, and time. He  appears well-developed and well-nourished. No distress.  HENT:  Head: Normocephalic and atraumatic.  Eyes: Conjunctivae and EOM are normal. Pupils are equal, round, and reactive to light.  Neck: Normal range of motion. Neck supple.  Cardiovascular: Normal rate and normal heart sounds.   No murmur heard. Pulmonary/Chest: Effort normal and breath sounds normal. No respiratory distress.  Abdominal: Soft. Bowel sounds are normal. There is no tenderness.  Musculoskeletal: He exhibits no edema or tenderness.  Neurological: He is alert and oriented to person, place, and time. No cranial nerve deficit.  Patient states subjective weakness of foot to dorsiflexion. However upgoing toes and downgoing toes of the right foot very normal. Sensation intact. Some weakness with raising the leg seems to be mostly due to pain in the back area.  Skin: Skin is warm.  Nursing note and vitals reviewed.   ED Course  Procedures (including critical care time) Labs Review Labs Reviewed - No data to display  Imaging Review No results found. I have personally reviewed and evaluated these images and lab results as part of my medical decision-making.   EKG Interpretation None      MDM   Final diagnoses:  Chronic low back pain  Sciatica, right    Patient with a history of a low back pain on and off chronic in nature.  Re-exacerbation of that about a month ago but here in the past few days is been dragging right leg. On examination it seems it may be due to pain in that toe strength up and down is fairly normal however this could be a worsening of sciatica diagnosis is had in the past had an MRI 2 months ago has not had surgery recommended before. Does have a primary care doctor recommend early follow-up with primary care doctor and consideration for repeat MRI. Patient will be treated with some pain medication in the meantime for the acute exacerbation.    Fredia Sorrow, MD 04/14/15 [phone]

## 2015-04-14 NOTE — ED Notes (Signed)
Lower back pain x 1 month-denies injury-pt presented to triage in w/c-states he is able to ambulate w/o device assist

## 2015-04-14 NOTE — Discharge Instructions (Signed)
Due to the worsening of the symptoms to the right leg and some weakness in the right leg important to follow-up with primary care doctor for repeat  MRI. Take medications as directed.

## 2015-05-08 ENCOUNTER — Encounter (HOSPITAL_BASED_OUTPATIENT_CLINIC_OR_DEPARTMENT_OTHER): Payer: Self-pay | Admitting: *Deleted

## 2015-05-08 ENCOUNTER — Emergency Department (HOSPITAL_BASED_OUTPATIENT_CLINIC_OR_DEPARTMENT_OTHER)
Admission: EM | Admit: 2015-05-08 | Discharge: 2015-05-08 | Disposition: A | Discharge status: Home / Self Care | Payer: Medicare HMO | Attending: Emergency Medicine | Admitting: Emergency Medicine

## 2015-05-08 DIAGNOSIS — Z8673 Personal history of transient ischemic attack (TIA), and cerebral infarction without residual deficits: Secondary | ICD-10-CM | POA: Insufficient documentation

## 2015-05-08 DIAGNOSIS — I10 Essential (primary) hypertension: Secondary | ICD-10-CM | POA: Insufficient documentation

## 2015-05-08 DIAGNOSIS — Z79899 Other long term (current) drug therapy: Secondary | ICD-10-CM | POA: Diagnosis not present

## 2015-05-08 DIAGNOSIS — G8929 Other chronic pain: Secondary | ICD-10-CM | POA: Diagnosis not present

## 2015-05-08 DIAGNOSIS — Z8719 Personal history of other diseases of the digestive system: Secondary | ICD-10-CM | POA: Insufficient documentation

## 2015-05-08 DIAGNOSIS — I252 Old myocardial infarction: Secondary | ICD-10-CM | POA: Diagnosis not present

## 2015-05-08 DIAGNOSIS — Z87891 Personal history of nicotine dependence: Secondary | ICD-10-CM | POA: Diagnosis not present

## 2015-05-08 DIAGNOSIS — Z7982 Long term (current) use of aspirin: Secondary | ICD-10-CM | POA: Insufficient documentation

## 2015-05-08 DIAGNOSIS — E119 Type 2 diabetes mellitus without complications: Secondary | ICD-10-CM | POA: Insufficient documentation

## 2015-05-08 DIAGNOSIS — Z85850 Personal history of malignant neoplasm of thyroid: Secondary | ICD-10-CM | POA: Diagnosis not present

## 2015-05-08 DIAGNOSIS — M5416 Radiculopathy, lumbar region: Secondary | ICD-10-CM | POA: Insufficient documentation

## 2015-05-08 DIAGNOSIS — M545 Low back pain: Secondary | ICD-10-CM | POA: Diagnosis present

## 2015-05-08 MED ORDER — CYCLOBENZAPRINE HCL 10 MG PO TABS
10.0000 mg | ORAL_TABLET | Freq: Once | ORAL | Status: AC
  Administered 2015-05-08: 10 mg via ORAL
  Filled 2015-05-08: qty 1

## 2015-05-08 MED ORDER — CYCLOBENZAPRINE HCL 10 MG PO TABS: 10.0000 mg | ORAL_TABLET | Freq: Two times a day (BID) | ORAL | Status: AC | PRN

## 2015-05-08 MED ORDER — OXYCODONE-ACETAMINOPHEN 5-325 MG PO TABS: 1.0000 | ORAL_TABLET | Freq: Four times a day (QID) | ORAL | Status: DC | PRN

## 2015-05-08 MED ORDER — HYDROMORPHONE HCL 1 MG/ML IJ SOLN
2.0000 mg | Freq: Once | INTRAMUSCULAR | Status: AC
  Administered 2015-05-08: 2 mg via INTRAMUSCULAR
  Filled 2015-05-08: qty 2

## 2015-05-08 NOTE — ED Notes (Signed)
High Fall Risk interventions initiated, pt instructed not to get up w/o staff assistance

## 2015-05-08 NOTE — ED Notes (Signed)
In to re-evaluate pt re: pain status, pt states he has relief and feels some better, but continues to rate pain at an 8 on 0-10 scale, EDP informed of pts current pain status

## 2015-05-08 NOTE — ED Notes (Signed)
Pt resting quietly, states feels much better after pain med administration, sr x 2 up, wife at bedside

## 2015-05-08 NOTE — ED Notes (Signed)
MD at bedside. 

## 2015-05-08 NOTE — ED Notes (Signed)
Presents with lower back pain, radiating to rt leg, pain increased over the past week, having difficulty walking, required wc and assistance to exam room and required assistance to stretcher

## 2015-05-08 NOTE — ED Notes (Signed)
Placed on cont POX monitoring, with int NBP assessments, sr x 2 up

## 2015-05-08 NOTE — ED Provider Notes (Addendum)
CSN: [id number]     Arrival date & time 05/08/15  1037 History   First MD Initiated Contact with Patient 05/08/15 1048     Chief Complaint  Patient presents with  . Back Pain     (Consider location/radiation/quality/duration/timing/severity/associated sxs/prior Treatment) Patient is a 64 y.o. male presenting with back pain. The history is provided by the patient.  Back Pain Location:  Lumbar spine Quality:  Aching, stabbing and shooting Radiates to:  R thigh and R foot Pain severity:  Severe Pain is:  Same all the time Onset quality:  Gradual Duration:  4 weeks Timing:  Constant Progression:  Worsening Chronicity:  Chronic Context: not falling, not jumping from heights, not lifting heavy objects, not recent illness and not twisting   Relieved by:  Narcotics Worsened by:  Ambulation and bending Ineffective treatments: tylenol. Associated symptoms: no abdominal pain, no bladder incontinence, no bowel incontinence, no fever and no weakness     Past Medical History  Diagnosis Date  . Hypertension   . Pancreatitis   . Myocardial infarct ([place])   . Diabetes mellitus without complication ([place])   . Chronic back pain   . Thyroid cancer ([place])   . Stroke [facility])     multiple in sept and oct 2013   Past Surgical History  Procedure Laterality Date  . Thyroidectomy    . Pancreas surgery    . Knee surgery    . Lumbar epidural injection    . Angioplasty     No family history on file. Social History  Substance Use Topics  . Smoking status: Former Smoker    Quit date: 06/08/2012  . Smokeless tobacco: Never Used  . Alcohol Use: No    Review of Systems  Constitutional: Negative for fever.  Gastrointestinal: Negative for abdominal pain and bowel incontinence.  Genitourinary: Negative for bladder incontinence.  Musculoskeletal: Positive for back pain.  Neurological: Negative for weakness.  All other systems reviewed and are negative.     Allergies  Ivp dye and  Spinach  Home Medications   Prior to Admission medications   Medication Sig Start Date End Date Taking? Authorizing Provider  ALPRAZOLAM PO Take by mouth 3 (three) times daily.    Historical Provider, MD  amLODipine (NORVASC) 10 MG tablet Take 10 mg by mouth every morning.     Historical Provider, MD  aspirin 325 MG EC tablet Take 81 mg by mouth every morning.     Historical Provider, MD  atenolol (TENORMIN) 100 MG tablet Take 100 mg by mouth every morning.     Historical Provider, MD  atorvastatin (LIPITOR) 20 MG tablet Take 20 mg by mouth at bedtime.      Historical Provider, MD  baclofen (LIORESAL) 10 MG tablet Take 1 tablet (10 mg total) by mouth 3 (three) times daily. 11/28/13   [name], PA-C  bisacodyl (DULCOLAX) 5 MG EC tablet Take 1 tablet (5 mg total) by mouth daily. 12/19/14   [name], MD  carbamazepine (TEGRETOL-XR) 100 MG 12 hr tablet Take 1 tablet (100 mg total) by mouth 2 (two) times daily. 05/18/13   Orpah Greek, MD  cloNIDine (CATAPRES) 0.2 MG tablet Take 0.2 mg by mouth at bedtime.      Historical Provider, MD  clopidogrel (PLAVIX) 75 MG tablet Take 75 mg by mouth every morning.     Historical Provider, MD  Diazepam (VALIUM PO) Take by mouth.    Historical Provider, MD  esomeprazole (NEXIUM) 40 MG capsule Take 40  mg by mouth daily before breakfast.      Historical Provider, MD  fentaNYL (DURAGESIC - DOSED MCG/HR) 75 MCG/HR Place 1 patch onto the skin every 3 (three) days.    Historical Provider, MD  furosemide (LASIX) 40 MG tablet Take 40 mg by mouth every morning.     Historical Provider, MD  gabapentin (NEURONTIN) 600 MG tablet Take 900 mg by mouth 3 (three) times daily as needed. Nerve pain    Historical Provider, MD  HYDROcodone-acetaminophen (NORCO/VICODIN) 5-325 MG per tablet Take 1 tablet by mouth every 6 (six) hours as needed. 11/25/14   Margarita Mail, PA-C  HYDROcodone-acetaminophen (NORCO/VICODIN) 5-325 MG per tablet Take 1-2 tablets by mouth  every 6 (six) hours as needed for moderate pain. 04/14/15   Fredia Sorrow, MD  hydrocortisone 2.5 % cream Apply topically 2 (two) times daily as needed. Hemorids     Historical Provider, MD  imipramine (TOFRANIL) 25 MG tablet Take 25 mg by mouth at bedtime.      Historical Provider, MD  isosorbide mononitrate (IMDUR) 30 MG 24 hr tablet Take 15 mg by mouth every morning.     Historical Provider, MD  levothyroxine (SYNTHROID, LEVOTHROID) 300 MCG tablet Take 300 mcg by mouth daily.      Historical Provider, MD  lidocaine (LIDODERM) 5 % Place 1 patch onto the skin daily. Remove & Discard patch within 12 hours or as directed by MD    Historical Provider, MD  losartan-hydrochlorothiazide (HYZAAR) 100-25 MG per tablet Take 1 tablet by mouth every morning.     Historical Provider, MD  metFORMIN (GLUCOPHAGE) 500 MG tablet Take 500 mg by mouth 2 (two) times daily with a meal.    Historical Provider, MD  nitroGLYCERIN (NITROSTAT) 0.4 MG SL tablet Place 0.4 mg under the tongue every 5 (five) minutes x 3 doses as needed. Chest pain    Historical Provider, MD  ondansetron (ZOFRAN) 4 MG tablet Take 4 mg by mouth every 8 (eight) hours as needed for nausea.    Historical Provider, MD  ondansetron (ZOFRAN) 4 MG tablet Take 1 tablet (4 mg total) by mouth every 6 (six) hours. 09/18/13   [name], MD  ondansetron (ZOFRAN-ODT) 8 MG disintegrating tablet Take 0.5-1 tablets (4-8 mg total) by mouth every 8 (eight) hours as needed for nausea or vomiting. 4mg  ODT q4 hours prn nausea/vomit 11/25/14   Margarita Mail, PA-C  oxyCODONE-acetaminophen (PERCOCET) 5-325 MG per tablet Take 1 tablet by mouth every 6 (six) hours as needed. Use sparingly as can increase constipation 12/19/14   [name], MD  Pancrelipase, Lip-Prot-Amyl, (ZENPEP) 5000 UNITS CPEP Take 3 capsules by mouth at bedtime.      Historical Provider, MD  potassium chloride (KLOR-CON) 10 MEQ CR tablet Take 10 mEq by mouth every morning.     Historical Provider,  MD  promethazine (PHENERGAN) 25 MG suppository Place 1 suppository (25 mg total) rectally every 6 (six) hours as needed for nausea. 10/17/14   [name], MD  SERTRALINE HCL PO Take by mouth at bedtime.    Historical Provider, MD   BP 147/76 mmHg  Pulse 80  Temp(Src) 98.5 F (36.9 C) (Oral)  Resp 22  Ht 6\' 1"  (1.854 m)  Wt 250 lb (113.399 kg)  BMI 32.99 kg/m2  SpO2 99% Physical Exam  Constitutional: He is oriented to person, place, and time. He appears well-developed and well-nourished. He appears distressed.  HENT:  Head: Normocephalic and atraumatic.  Mouth/Throat: Oropharynx is clear and moist.  Eyes: EOM are normal. Pupils are equal, round, and reactive to light.  Neck: Normal range of motion. Neck supple. No spinous process tenderness and no muscular tenderness present. No rigidity. Normal range of motion present.  Cardiovascular: Normal rate, regular rhythm, normal heart sounds and intact distal pulses.   No murmur heard. Pulmonary/Chest: Effort normal and breath sounds normal. He has no wheezes. He has no rales.  Abdominal: Soft. He exhibits no distension. There is no tenderness. There is no CVA tenderness.  Musculoskeletal: He exhibits tenderness.       Lumbar back: He exhibits decreased range of motion, pain and spasm. He exhibits no swelling, no deformity and normal pulse.  Positive straight leg raise bilaterally  Neurological: He is alert and oriented to person, place, and time. He has normal strength. No sensory deficit. Coordination normal.  Reflex Scores:      Patellar reflexes are 1+ on the right side and 1+ on the left side. Able to dorisflex and plantar flex the foot.  Refuses to move the leg due to pain.  Skin: Skin is warm and dry. No rash noted.  Psychiatric: He has a normal mood and affect.  Nursing note and vitals reviewed.   ED Course  Procedures (including critical care time) Labs Review Labs Reviewed - No data to display  Imaging Review No results  found. I have personally reviewed and evaluated these images and lab results as part of my medical decision-making.   EKG Interpretation None      MDM   Final diagnoses:  Lumbar radiculopathy, chronic    Pt with gradual onset of back pain suggestive of radiculopathy.  No neurovascular compromise and no incontinence.  Pt has no infectious sx, hx of CA  or other red flags concerning for pathologic back pain.  Pt is able to ambulate but is painful.  Normal strength and reflexes on exam.  Denies trauma.  Pt has been here multiple times in the past for the same.  Seeing a neurologist on Monday.  Wears a fentanyl patch chronically for pancreatitis and denies any issues with that.  No recent procedures to the back. Will give pt pain control and to return for developement of above sx.  12:07 PM Some improvement in pain.  Pt d/ced home with pain meds and neurology f/u on Monday.   [name], MD 05/08/15 [place], MD 05/08/15 1209

## 2015-07-14 ENCOUNTER — Emergency Department (HOSPITAL_BASED_OUTPATIENT_CLINIC_OR_DEPARTMENT_OTHER)
Admission: EM | Admit: 2015-07-14 | Discharge: 2015-07-14 | Disposition: A | Discharge status: Home / Self Care | Payer: Medicare HMO | Attending: Emergency Medicine | Admitting: Emergency Medicine

## 2015-07-14 ENCOUNTER — Encounter (HOSPITAL_BASED_OUTPATIENT_CLINIC_OR_DEPARTMENT_OTHER): Payer: Self-pay | Admitting: *Deleted

## 2015-07-14 DIAGNOSIS — Z8719 Personal history of other diseases of the digestive system: Secondary | ICD-10-CM | POA: Diagnosis not present

## 2015-07-14 DIAGNOSIS — G8929 Other chronic pain: Secondary | ICD-10-CM | POA: Insufficient documentation

## 2015-07-14 DIAGNOSIS — I10 Essential (primary) hypertension: Secondary | ICD-10-CM | POA: Diagnosis not present

## 2015-07-14 DIAGNOSIS — Z79899 Other long term (current) drug therapy: Secondary | ICD-10-CM | POA: Diagnosis not present

## 2015-07-14 DIAGNOSIS — Z85850 Personal history of malignant neoplasm of thyroid: Secondary | ICD-10-CM | POA: Diagnosis not present

## 2015-07-14 DIAGNOSIS — Z8673 Personal history of transient ischemic attack (TIA), and cerebral infarction without residual deficits: Secondary | ICD-10-CM | POA: Insufficient documentation

## 2015-07-14 DIAGNOSIS — M5416 Radiculopathy, lumbar region: Secondary | ICD-10-CM | POA: Diagnosis not present

## 2015-07-14 DIAGNOSIS — Z7902 Long term (current) use of antithrombotics/antiplatelets: Secondary | ICD-10-CM | POA: Insufficient documentation

## 2015-07-14 DIAGNOSIS — Z87891 Personal history of nicotine dependence: Secondary | ICD-10-CM | POA: Insufficient documentation

## 2015-07-14 DIAGNOSIS — M545 Low back pain: Secondary | ICD-10-CM | POA: Diagnosis present

## 2015-07-14 DIAGNOSIS — Z7982 Long term (current) use of aspirin: Secondary | ICD-10-CM | POA: Diagnosis not present

## 2015-07-14 DIAGNOSIS — I252 Old myocardial infarction: Secondary | ICD-10-CM | POA: Insufficient documentation

## 2015-07-14 DIAGNOSIS — E119 Type 2 diabetes mellitus without complications: Secondary | ICD-10-CM | POA: Diagnosis not present

## 2015-07-14 MED ORDER — LIDOCAINE-EPINEPHRINE 2 %-1:100000 IJ SOLN
20.0000 mL | Freq: Once | INTRAMUSCULAR | Status: AC
  Administered 2015-07-14: 20 mL
  Filled 2015-07-14: qty 1

## 2015-07-14 MED ORDER — HYDROCODONE-ACETAMINOPHEN 5-325 MG PO TABS: 1.0000 | ORAL_TABLET | Freq: Four times a day (QID) | ORAL | Status: DC | PRN

## 2015-07-14 MED ORDER — HYDROMORPHONE HCL 1 MG/ML IJ SOLN
1.0000 mg | Freq: Once | INTRAMUSCULAR | Status: AC
  Administered 2015-07-14: 1 mg via INTRAMUSCULAR
  Filled 2015-07-14: qty 1

## 2015-07-14 MED ORDER — BUPIVACAINE-EPINEPHRINE (PF) 0.5% -1:200000 IJ SOLN
10.0000 mL | Freq: Once | INTRAMUSCULAR | Status: DC
  Filled 2015-07-14: qty 10.8

## 2015-07-14 NOTE — ED Notes (Signed)
C/o lower right back pain to right hip and down right leg to knee. Onset yesterday. States his back has went out before but has never affected his leg. Denies injury.

## 2015-07-14 NOTE — ED Provider Notes (Signed)
CSN: [id number]     Arrival date & time 07/14/15  1051 History   First MD Initiated Contact with Patient 07/14/15 1112     No chief complaint on file.    (Consider location/radiation/quality/duration/timing/severity/associated sxs/prior Treatment) Patient is a 64 y.o. male presenting with back pain. The history is provided by the patient.  Back Pain Location:  Lumbar spine Quality:  Aching, stiffness, stabbing and shooting Stiffness is present:  All day Radiates to:  R thigh Pain severity:  Severe Onset quality:  Sudden Duration:  1 week Timing:  Constant Progression:  Worsening Chronicity:  Recurrent Context comment:  Patient has chronic pain however one week ago he went to get up from a seated position and felt like his legs gave out on him and he almost fell. Since that time he's had a severe pain in the right side of his back radiating down to his knee.  Relieved by:  Nothing Worsened by:  Movement, bending and ambulation Ineffective treatments:  Bed rest and muscle relaxants (Patient worse but no patches appear not helpful.) Associated symptoms: no abdominal pain, no abdominal swelling, no bladder incontinence, no bowel incontinence, no dysuria, no fever, no paresthesias and no weakness   Risk factors comment:  Patient has a history of chronic pain for which he wears 50 g fentanyl patches which he changes every 3 days. At this time he takes nothing for breakthrough pain however he is scheduled to see a pain management specialist on the 21st   Past Medical History  Diagnosis Date  . Hypertension   . Pancreatitis   . Myocardial infarct ([place])   . Diabetes mellitus without complication ([place])   . Chronic back pain   . Thyroid cancer ([place])   . Stroke [facility])     multiple in sept and oct 2013   Past Surgical History  Procedure Laterality Date  . Thyroidectomy    . Pancreas surgery    . Knee surgery    . Lumbar epidural injection    . Angioplasty     No family history on  file. Social History  Substance Use Topics  . Smoking status: Former Smoker    Quit date: 06/08/2012  . Smokeless tobacco: Never Used  . Alcohol Use: No    Review of Systems  Constitutional: Negative for fever.  Gastrointestinal: Negative for abdominal pain and bowel incontinence.  Genitourinary: Negative for bladder incontinence and dysuria.  Musculoskeletal: Positive for back pain.  Neurological: Negative for weakness and paresthesias.  All other systems reviewed and are negative.     Allergies  Ivp dye and Spinach  Home Medications   Prior to Admission medications   Medication Sig Start Date End Date Taking? Authorizing Provider  amLODipine (NORVASC) 10 MG tablet Take 10 mg by mouth every morning.    Yes Historical Provider, MD  aspirin 325 MG EC tablet Take 81 mg by mouth every morning.    Yes Historical Provider, MD  atenolol (TENORMIN) 100 MG tablet Take 100 mg by mouth every morning.    Yes Historical Provider, MD  atorvastatin (LIPITOR) 20 MG tablet Take 20 mg by mouth at bedtime.     Yes Historical Provider, MD  baclofen (LIORESAL) 10 MG tablet Take 1 tablet (10 mg total) by mouth 3 (three) times daily. 11/28/13  Yes [name], PA-C  bisacodyl (DULCOLAX) 5 MG EC tablet Take 1 tablet (5 mg total) by mouth daily. 12/19/14  Yes [name], MD  clopidogrel (PLAVIX) 75 MG tablet Take  75 mg by mouth every morning.    Yes Historical Provider, MD  Diazepam (VALIUM PO) Take by mouth.   Yes Historical Provider, MD  esomeprazole (NEXIUM) 40 MG capsule Take 40 mg by mouth daily before breakfast.     Yes Historical Provider, MD  fentaNYL (DURAGESIC - DOSED MCG/HR) 75 MCG/HR Place 1 patch onto the skin every 3 (three) days.   Yes Historical Provider, MD  furosemide (LASIX) 40 MG tablet Take 40 mg by mouth every morning.    Yes Historical Provider, MD  gabapentin (NEURONTIN) 600 MG tablet Take 900 mg by mouth 3 (three) times daily as needed. Nerve pain   Yes Historical  Provider, MD  hydrocortisone 2.5 % cream Apply topically 2 (two) times daily as needed. Hemorids    Yes Historical Provider, MD  isosorbide mononitrate (IMDUR) 30 MG 24 hr tablet Take 15 mg by mouth every morning.    Yes Historical Provider, MD  levothyroxine (SYNTHROID, LEVOTHROID) 300 MCG tablet Take 300 mcg by mouth daily.     Yes Historical Provider, MD  metFORMIN (GLUCOPHAGE) 500 MG tablet Take 500 mg by mouth 2 (two) times daily with a meal.   Yes Historical Provider, MD  nitroGLYCERIN (NITROSTAT) 0.4 MG SL tablet Place 0.4 mg under the tongue every 5 (five) minutes x 3 doses as needed. Chest pain   Yes Historical Provider, MD  ondansetron (ZOFRAN) 4 MG tablet Take 4 mg by mouth every 8 (eight) hours as needed for nausea.   Yes Historical Provider, MD  ondansetron (ZOFRAN) 4 MG tablet Take 1 tablet (4 mg total) by mouth every 6 (six) hours. 09/18/13  Yes [name], MD  ondansetron (ZOFRAN-ODT) 8 MG disintegrating tablet Take 0.5-1 tablets (4-8 mg total) by mouth every 8 (eight) hours as needed for nausea or vomiting. 4mg  ODT q4 hours prn nausea/vomit 11/25/14  Yes [name], PA-C  Pancrelipase, Lip-Prot-Amyl, (ZENPEP) 5000 UNITS CPEP Take 3 capsules by mouth at bedtime.     Yes Historical Provider, MD  promethazine (PHENERGAN) 25 MG suppository Place 1 suppository (25 mg total) rectally every 6 (six) hours as needed for nausea. 10/17/14  Yes [name], MD  SERTRALINE HCL PO Take by mouth at bedtime.   Yes Historical Provider, MD  cyclobenzaprine (FLEXERIL) 10 MG tablet Take 1 tablet (10 mg total) by mouth 2 (two) times daily as needed for muscle spasms. 05/08/15   [name], MD  HYDROcodone-acetaminophen (NORCO/VICODIN) 5-325 MG tablet Take 1-2 tablets by mouth every 6 (six) hours as needed. 07/14/15   [name], MD   BP 149/96 mmHg  Pulse 75  Temp(Src) 99.1 F (37.3 C) (Oral)  Resp 18  Ht 6\' 1"  (1.854 m)  Wt 220 lb (99.791 kg)  BMI 29.03 kg/m2  SpO2  97% Physical Exam  Constitutional: He is oriented to person, place, and time. He appears well-developed and well-nourished. He appears distressed.  HENT:  Head: Normocephalic and atraumatic.  Mouth/Throat: Oropharynx is clear and moist.  Eyes: EOM are normal. Pupils are equal, round, and reactive to light.  Neck: Normal range of motion. Neck supple. No spinous process tenderness and no muscular tenderness present. No rigidity. Normal range of motion present.  Cardiovascular: Normal rate and intact distal pulses.   Pulmonary/Chest: Effort normal.  Abdominal: Soft. He exhibits no distension. There is no tenderness. There is no CVA tenderness.  Musculoskeletal: He exhibits tenderness.       Lumbar back: He exhibits decreased range of motion, tenderness, bony tenderness and  pain. He exhibits no swelling, no deformity and normal pulse.       Back:  Neurological: He is alert and oriented to person, place, and time. Coordination normal.  Reflex Scores:      Patellar reflexes are 1+ on the right side and 1+ on the left side. 5 out of 5 strength in bilateral lower extremities. Positive straight leg raise  Skin: Skin is warm and dry. No rash noted.  Psychiatric: He has a normal mood and affect.  Nursing note and vitals reviewed.   ED Course  Procedures (including critical care time) Labs Review Labs Reviewed - No data to display  Imaging Review No results found. I have personally reviewed and evaluated these images and lab results as part of my medical decision-making.   EKG Interpretation None      MDM   Final diagnoses:  Lumbar radiculopathy, acute    Pt with gradual onset of back pain suggestive of lumbar radiculopathy.  No neurovascular compromise and no incontinence.  Pt has no infectious sx, hx of CA  or other red flags concerning for pathologic back pain.  Pt is able to ambulate but is painful.  Normal strength  on exam.  Denies trauma. Will give pt pain control and to  return for developement of above sx.     [name], MD 07/14/15 1235

## 2015-10-01 ENCOUNTER — Emergency Department (HOSPITAL_BASED_OUTPATIENT_CLINIC_OR_DEPARTMENT_OTHER)
Admission: EM | Admit: 2015-10-01 | Discharge: 2015-10-01 | Disposition: A | Discharge status: Home / Self Care | Payer: Medicare HMO | Attending: Emergency Medicine | Admitting: Emergency Medicine

## 2015-10-01 ENCOUNTER — Encounter (HOSPITAL_BASED_OUTPATIENT_CLINIC_OR_DEPARTMENT_OTHER): Payer: Self-pay | Admitting: *Deleted

## 2015-10-01 ENCOUNTER — Emergency Department (HOSPITAL_BASED_OUTPATIENT_CLINIC_OR_DEPARTMENT_OTHER): Payer: Medicare HMO

## 2015-10-01 DIAGNOSIS — Z87891 Personal history of nicotine dependence: Secondary | ICD-10-CM | POA: Diagnosis not present

## 2015-10-01 DIAGNOSIS — Z85850 Personal history of malignant neoplasm of thyroid: Secondary | ICD-10-CM | POA: Insufficient documentation

## 2015-10-01 DIAGNOSIS — I252 Old myocardial infarction: Secondary | ICD-10-CM | POA: Insufficient documentation

## 2015-10-01 DIAGNOSIS — E119 Type 2 diabetes mellitus without complications: Secondary | ICD-10-CM | POA: Insufficient documentation

## 2015-10-01 DIAGNOSIS — Z7984 Long term (current) use of oral hypoglycemic drugs: Secondary | ICD-10-CM | POA: Diagnosis not present

## 2015-10-01 DIAGNOSIS — I10 Essential (primary) hypertension: Secondary | ICD-10-CM | POA: Diagnosis not present

## 2015-10-01 DIAGNOSIS — Z9889 Other specified postprocedural states: Secondary | ICD-10-CM | POA: Diagnosis not present

## 2015-10-01 DIAGNOSIS — Z7982 Long term (current) use of aspirin: Secondary | ICD-10-CM | POA: Insufficient documentation

## 2015-10-01 DIAGNOSIS — G8929 Other chronic pain: Secondary | ICD-10-CM | POA: Insufficient documentation

## 2015-10-01 DIAGNOSIS — R112 Nausea with vomiting, unspecified: Secondary | ICD-10-CM | POA: Insufficient documentation

## 2015-10-01 DIAGNOSIS — Z7902 Long term (current) use of antithrombotics/antiplatelets: Secondary | ICD-10-CM | POA: Insufficient documentation

## 2015-10-01 DIAGNOSIS — Z79899 Other long term (current) drug therapy: Secondary | ICD-10-CM | POA: Insufficient documentation

## 2015-10-01 DIAGNOSIS — Z8673 Personal history of transient ischemic attack (TIA), and cerebral infarction without residual deficits: Secondary | ICD-10-CM | POA: Diagnosis not present

## 2015-10-01 DIAGNOSIS — R1013 Epigastric pain: Secondary | ICD-10-CM | POA: Insufficient documentation

## 2015-10-01 DIAGNOSIS — Z9861 Coronary angioplasty status: Secondary | ICD-10-CM | POA: Insufficient documentation

## 2015-10-01 DIAGNOSIS — R109 Unspecified abdominal pain: Secondary | ICD-10-CM | POA: Diagnosis present

## 2015-10-01 DIAGNOSIS — Z8719 Personal history of other diseases of the digestive system: Secondary | ICD-10-CM | POA: Diagnosis not present

## 2015-10-01 LAB — COMPREHENSIVE METABOLIC PANEL
ALT: 23 U/L (ref 17–63)
ANION GAP: 9 (ref 5–15)
AST: 30 U/L (ref 15–41)
Albumin: 3.8 g/dL (ref 3.5–5.0)
Alkaline Phosphatase: 48 U/L (ref 38–126)
BUN: 10 mg/dL (ref 6–20)
CALCIUM: 8.6 mg/dL — AB (ref 8.9–10.3)
CO2: 30 mmol/L (ref 22–32)
Chloride: 100 mmol/L — ABNORMAL LOW (ref 101–111)
Creatinine, Ser: 0.94 mg/dL (ref 0.61–1.24)
GFR calc non Af Amer: >60 mL/min (ref >60)
Glucose, Bld: 104 mg/dL — ABNORMAL HIGH (ref 65–99)
POTASSIUM: 4.4 mmol/L (ref 3.5–5.1)
Sodium: 139 mmol/L (ref 135–145)
Total Bilirubin: 0.4 mg/dL (ref 0.3–1.2)
Total Protein: 7.6 g/dL (ref 6.5–8.1)

## 2015-10-01 LAB — CBC WITH DIFFERENTIAL/PLATELET
BASOS PCT: 1 %
Basophils Absolute: 0 10*3/uL (ref 0.0–0.1)
EOS ABS: 0 10*3/uL (ref 0.0–0.7)
Eosinophils Relative: 1 %
HEMATOCRIT: 37.9 % — AB (ref 39.0–52.0)
HEMOGLOBIN: 12.4 g/dL — AB (ref 13.0–17.0)
Lymphocytes Relative: 28 %
Lymphs Abs: 1.4 10*3/uL (ref 0.7–4.0)
MCH: 23.6 pg — ABNORMAL LOW (ref 26.0–34.0)
MCHC: 32.7 g/dL (ref 30.0–36.0)
MCV: 72.1 fL — ABNORMAL LOW (ref 78.0–100.0)
MONOS PCT: 8 %
Monocytes Absolute: 0.4 10*3/uL (ref 0.1–1.0)
NEUTROS ABS: 3.1 10*3/uL (ref 1.7–7.7)
NEUTROS PCT: 62 %
Platelets: 242 10*3/uL (ref 150–400)
RBC: 5.26 MIL/uL (ref 4.22–5.81)
RDW: 16.1 % — ABNORMAL HIGH (ref 11.5–15.5)
WBC: 4.9 10*3/uL (ref 4.0–10.5)

## 2015-10-01 LAB — LIPASE, BLOOD: Lipase: 20 U/L (ref 11–51)

## 2015-10-01 MED ORDER — ONDANSETRON HCL 4 MG/2ML IJ SOLN
4.0000 mg | Freq: Once | INTRAMUSCULAR | Status: AC
  Administered 2015-10-01: 4 mg via INTRAVENOUS
  Filled 2015-10-01: qty 2

## 2015-10-01 MED ORDER — HYDROMORPHONE HCL 1 MG/ML IJ SOLN
1.0000 mg | Freq: Once | INTRAMUSCULAR | Status: AC
  Administered 2015-10-01: 1 mg via INTRAVENOUS
  Filled 2015-10-01: qty 1

## 2015-10-01 MED ORDER — SODIUM CHLORIDE 0.9 % IV BOLUS (SEPSIS)
1000.0000 mL | Freq: Once | INTRAVENOUS | Status: AC
Start: 2015-10-01 — End: 2015-10-01
  Administered 2015-10-01: 1000 mL via INTRAVENOUS

## 2015-10-01 MED ORDER — MORPHINE SULFATE (PF) 4 MG/ML IV SOLN
4.0000 mg | Freq: Once | INTRAVENOUS | Status: AC
  Administered 2015-10-01: 4 mg via INTRAVENOUS
  Filled 2015-10-01: qty 1

## 2015-10-01 NOTE — ED Provider Notes (Signed)
CSN: [id number]     Arrival date & time 10/01/15  1425 History   First MD Initiated Contact with Patient 10/01/15 1600     Chief Complaint  Patient presents with  . Abdominal Pain     (Consider location/radiation/quality/duration/timing/severity/associated sxs/prior Treatment) HPI Robert Meadows is a 65 y.o. male history of chronic abdominal pain, pancreatitis, MI, thyroidectomy, hypertension, comes in for evaluation of abdominal pain. Patient reports a 3 day history of gradually worsening, epigastric abdominal pain consistent with his previous flares of pancreatitis. He reports associated nausea and vomiting "multiple times", nonbloody and nonbilious. He has not taken anything to improve his symptoms. He has not followed up with his PCP for this problem. Palpation worsens his discomfort. Denies any fevers, chills, shortness of breath, cough, chest pain, urinary symptoms, back pain. No other alleviating or modifying factors.  Past Medical History  Diagnosis Date  . Hypertension   . Pancreatitis   . Myocardial infarct ([place])   . Diabetes mellitus without complication ([place])   . Chronic back pain   . Thyroid cancer ([place])   . Stroke [facility])     multiple in sept and oct 2013   Past Surgical History  Procedure Laterality Date  . Thyroidectomy    . Pancreas surgery    . Knee surgery    . Lumbar epidural injection    . Angioplasty     No family history on file. Social History  Substance Use Topics  . Smoking status: Former Smoker    Quit date: 06/08/2012  . Smokeless tobacco: Never Used  . Alcohol Use: No    Review of Systems A 10 point review of systems was completed and was negative except for pertinent positives and negatives as mentioned in the history of present illness     Allergies  Ivp dye and Spinach  Home Medications   Prior to Admission medications   Medication Sig Start Date End Date Taking? Authorizing Provider  amLODipine (NORVASC) 10 MG tablet Take 10 mg by  mouth every morning.     Historical Provider, MD  aspirin 325 MG EC tablet Take 81 mg by mouth every morning.     Historical Provider, MD  atenolol (TENORMIN) 100 MG tablet Take 100 mg by mouth every morning.     Historical Provider, MD  atorvastatin (LIPITOR) 20 MG tablet Take 20 mg by mouth at bedtime.      Historical Provider, MD  baclofen (LIORESAL) 10 MG tablet Take 1 tablet (10 mg total) by mouth 3 (three) times daily. 11/28/13   [name], PA-C  bisacodyl (DULCOLAX) 5 MG EC tablet Take 1 tablet (5 mg total) by mouth daily. 12/19/14   [name], MD  clopidogrel (PLAVIX) 75 MG tablet Take 75 mg by mouth every morning.     Historical Provider, MD  cyclobenzaprine (FLEXERIL) 10 MG tablet Take 1 tablet (10 mg total) by mouth 2 (two) times daily as needed for muscle spasms. 05/08/15   [name], MD  Diazepam (VALIUM PO) Take by mouth.    Historical Provider, MD  esomeprazole (NEXIUM) 40 MG capsule Take 40 mg by mouth daily before breakfast.      Historical Provider, MD  fentaNYL (DURAGESIC - DOSED MCG/HR) 75 MCG/HR Place 1 patch onto the skin every 3 (three) days.    Historical Provider, MD  furosemide (LASIX) 40 MG tablet Take 40 mg by mouth every morning.     Historical Provider, MD  gabapentin (NEURONTIN) 600 MG tablet Take 900 mg by  mouth 3 (three) times daily as needed. Nerve pain    Historical Provider, MD  HYDROcodone-acetaminophen (NORCO/VICODIN) 5-325 MG tablet Take 1-2 tablets by mouth every 6 (six) hours as needed. 07/14/15   [name], MD  hydrocortisone 2.5 % cream Apply topically 2 (two) times daily as needed. Hemorids     Historical Provider, MD  isosorbide mononitrate (IMDUR) 30 MG 24 hr tablet Take 15 mg by mouth every morning.     Historical Provider, MD  levothyroxine (SYNTHROID, LEVOTHROID) 300 MCG tablet Take 300 mcg by mouth daily.      Historical Provider, MD  metFORMIN (GLUCOPHAGE) 500 MG tablet Take 500 mg by mouth 2 (two) times daily with a meal.     Historical Provider, MD  nitroGLYCERIN (NITROSTAT) 0.4 MG SL tablet Place 0.4 mg under the tongue every 5 (five) minutes x 3 doses as needed. Chest pain    Historical Provider, MD  ondansetron (ZOFRAN) 4 MG tablet Take 4 mg by mouth every 8 (eight) hours as needed for nausea.    Historical Provider, MD  ondansetron (ZOFRAN) 4 MG tablet Take 1 tablet (4 mg total) by mouth every 6 (six) hours. 09/18/13   [name], MD  ondansetron (ZOFRAN-ODT) 8 MG disintegrating tablet Take 0.5-1 tablets (4-8 mg total) by mouth every 8 (eight) hours as needed for nausea or vomiting. 4mg  ODT q4 hours prn nausea/vomit 11/25/14   [name], PA-C  Pancrelipase, Lip-Prot-Amyl, (ZENPEP) 5000 UNITS CPEP Take 3 capsules by mouth at bedtime.      Historical Provider, MD  promethazine (PHENERGAN) 25 MG suppository Place 1 suppository (25 mg total) rectally every 6 (six) hours as needed for nausea. 10/17/14   [name], MD  SERTRALINE HCL PO Take by mouth at bedtime.    Historical Provider, MD   BP 143/85 mmHg  Pulse 89  Temp(Src) 98.7 F (37.1 C) (Oral)  Resp 20  Ht 6\' 1"  (1.854 m)  Wt 102.059 kg  BMI 29.69 kg/m2  SpO2 100% Physical Exam  Constitutional: He is oriented to person, place, and time. He appears well-developed and well-nourished.  HENT:  Head: Normocephalic and atraumatic.  Mouth/Throat: Oropharynx is clear and moist.  Eyes: Conjunctivae are normal. Pupils are equal, round, and reactive to light. Right eye exhibits no discharge. Left eye exhibits no discharge. No scleral icterus.  Neck: Neck supple.  Cardiovascular: Normal rate, regular rhythm and normal heart sounds.   Pulmonary/Chest: Effort normal and breath sounds normal. No respiratory distress. He has no wheezes. He has no rales.  Abdominal: Soft. He exhibits no distension, no pulsatile liver, no fluid wave, no abdominal bruit, no pulsatile midline mass and no mass. There is no hepatosplenomegaly. There is tenderness in the epigastric  area. There is no rigidity, no rebound and no guarding.  Musculoskeletal: He exhibits no tenderness.  Neurological: He is alert and oriented to person, place, and time.  Cranial Nerves II-XII grossly intact  Skin: Skin is warm and dry. No rash noted.  Psychiatric: He has a normal mood and affect.  Nursing note and vitals reviewed.   ED Course  Procedures (including critical care time) Labs Review Labs Reviewed  CBC WITH DIFFERENTIAL/PLATELET - Abnormal; Notable for the following:    Hemoglobin 12.4 (*)    HCT 37.9 (*)    MCV 72.1 (*)    MCH 23.6 (*)    RDW 16.1 (*)    All other components within normal limits  COMPREHENSIVE METABOLIC PANEL - Abnormal; Notable for the following:  Chloride 100 (*)    Glucose, Bld 104 (*)    Calcium 8.6 (*)    All other components within normal limits  LIPASE, BLOOD    Imaging Review Dg Abd Acute W/chest  10/01/2015  CLINICAL DATA:  65 year old presenting with 3 day history of epigastric abdominal pain, nausea and vomiting. EXAM: DG ABDOMEN ACUTE W/ 1V CHEST COMPARISON:  Acute abdomen series 12/19/2014. CT abdomen and pelvis 03/06/2015. FINDINGS: Bowel gas pattern unremarkable without evidence of obstruction or significant ileus. No evidence of free air or significant air-fluid levels on the erect image. Expected stool burden in the colon. Numerous pelvic phleboliths. No visible opaque urinary tract calculi. Degenerative changes throughout the thoracic and lumbar spine. Cardiomediastinal silhouette unremarkable, unchanged. Lungs clear. Bronchovascular markings normal. Pulmonary vascularity normal. No visible pleural effusions. No pneumothorax. IMPRESSION: 1. No acute abdominal abnormality. 2.  No acute cardiopulmonary disease. Electronically Signed   By: Evangeline Dakin M.D.   On: 10/01/2015 16:35   I have personally reviewed and evaluated these images and lab results as part of my medical decision-making.   EKG Interpretation   Date/Time:   Friday October 01 2015 17:31:49 EST Ventricular Rate:  83 PR Interval:  140 QRS Duration: 88 QT Interval:  382 QTC Calculation: 449 R Axis:   38 Text Interpretation:  Sinus rhythm Abnormal R-wave progression, early  transition ED PHYSICIAN INTERPRETATION AVAILABLE IN [facility]  Confirmed by TEST, Record ([id number]) on 10/02/2015 9:06:04 AM     Meds given in ED:  Medications  sodium chloride 0.9 % bolus 1,000 mL (0 mLs Intravenous Stopped 10/01/15 1642)  morphine 4 MG/ML injection 4 mg (4 mg Intravenous Given 10/01/15 1542)  HYDROmorphone (DILAUDID) injection 1 mg (1 mg Intravenous Given 10/01/15 1616)  ondansetron (ZOFRAN) injection 4 mg (4 mg Intravenous Given 10/01/15 1616)  HYDROmorphone (DILAUDID) injection 1 mg (1 mg Intravenous Given 10/01/15 1722)    New Prescriptions   No medications on file   Filed Vitals:   10/01/15 1433 10/01/15 1544 10/01/15 1659  BP: 160/100  143/85  Pulse: 86 77 89  Temp: 99.3 F (37.4 C) 98.6 F (37 C) 98.7 F (37.1 C)  TempSrc:  Oral Oral  Resp: 20 22 20   Height: 6\' 1"  (1.854 m)    Weight: 102.059 kg    SpO2: 98% 100% 100%    MDM  Piere Sumsion is a 65 y.o. male history of pancreatitis, chronic abdominal pain, hypertension, comes in for evaluation of epigastric pain. Patient reports this is typical of his previous pancreatitis flares. On Arrival, he is hemodynamically stable, afebrile. On initial evaluation, he is tender in his epigastric region but there is no evidence of acute or surgical abdomen.  Basic labs appear baseline for patient, lipase 20. Plan to obtain plain films of abdomen/chest. Pain and nausea medicine in the ED. States he feels much better. Tolerating PO's, no vomiting in ED. Seems to be exacerbation of chronic pain. Not consistent with ACS, EKG is reassuring. Will have patient follow up with PCP for further evaluation and management of chronic abdominal pain. Patient verbalizes understanding and agrees with this plan. Overall,  patient appears well, nontoxic, hemodynamically stable and appropriate for discharge. Prior to patient discharge, I discussed and reviewed this case with Dr.Mesner   Final diagnoses:  Abdominal pain, chronic, epigastric        Comer Locket, PA-C 10/02/15 1600  Merrily Pew, MD 10/02/15 [id number]

## 2015-10-01 NOTE — ED Notes (Signed)
3 days of abd pain, vomiting today- hx of pancreatitis

## 2015-10-01 NOTE — Discharge Instructions (Signed)
You were evaluated in the ED today for your abdominal pain. There does not appear to be an emergent cause for your symptoms at this time. Your labs are reassuring as was your imaging and EKG. It is important for you to stay well hydrated and drink plenty of water. Follow-up with your doctor in the next 2-3 days for reevaluation. Return to ED for new or worsening symptoms

## 2015-10-01 NOTE — ED Notes (Signed)
Pt drinking gingerale at this time

## 2015-12-11 ENCOUNTER — Emergency Department (HOSPITAL_BASED_OUTPATIENT_CLINIC_OR_DEPARTMENT_OTHER)
Admission: EM | Admit: 2015-12-11 | Discharge: 2015-12-11 | Disposition: A | Discharge status: Home / Self Care | Payer: Medicare HMO | Attending: Emergency Medicine | Admitting: Emergency Medicine

## 2015-12-11 ENCOUNTER — Encounter (HOSPITAL_BASED_OUTPATIENT_CLINIC_OR_DEPARTMENT_OTHER): Payer: Self-pay | Admitting: Emergency Medicine

## 2015-12-11 DIAGNOSIS — G8929 Other chronic pain: Secondary | ICD-10-CM | POA: Diagnosis not present

## 2015-12-11 DIAGNOSIS — Z85850 Personal history of malignant neoplasm of thyroid: Secondary | ICD-10-CM | POA: Insufficient documentation

## 2015-12-11 DIAGNOSIS — Z79899 Other long term (current) drug therapy: Secondary | ICD-10-CM | POA: Insufficient documentation

## 2015-12-11 DIAGNOSIS — Z87891 Personal history of nicotine dependence: Secondary | ICD-10-CM | POA: Insufficient documentation

## 2015-12-11 DIAGNOSIS — E119 Type 2 diabetes mellitus without complications: Secondary | ICD-10-CM | POA: Diagnosis not present

## 2015-12-11 DIAGNOSIS — Z8673 Personal history of transient ischemic attack (TIA), and cerebral infarction without residual deficits: Secondary | ICD-10-CM | POA: Insufficient documentation

## 2015-12-11 DIAGNOSIS — I10 Essential (primary) hypertension: Secondary | ICD-10-CM | POA: Insufficient documentation

## 2015-12-11 DIAGNOSIS — M545 Low back pain: Secondary | ICD-10-CM | POA: Insufficient documentation

## 2015-12-11 DIAGNOSIS — M549 Dorsalgia, unspecified: Secondary | ICD-10-CM

## 2015-12-11 DIAGNOSIS — I252 Old myocardial infarction: Secondary | ICD-10-CM | POA: Insufficient documentation

## 2015-12-11 LAB — BASIC METABOLIC PANEL
Anion gap: 4 — ABNORMAL LOW (ref 5–15)
BUN: 32 mg/dL — ABNORMAL HIGH (ref 6–20)
CALCIUM: 8.1 mg/dL — AB (ref 8.9–10.3)
CO2: 27 mmol/L (ref 22–32)
CREATININE: 1.94 mg/dL — AB (ref 0.61–1.24)
Chloride: 105 mmol/L (ref 101–111)
GFR calc Af Amer: 41 mL/min — ABNORMAL LOW (ref >60)
GFR, EST NON AFRICAN AMERICAN: 35 mL/min — AB (ref >60)
GLUCOSE: 80 mg/dL (ref 65–99)
Potassium: 3.7 mmol/L (ref 3.5–5.1)
SODIUM: 136 mmol/L (ref 135–145)

## 2015-12-11 LAB — CBC WITH DIFFERENTIAL/PLATELET
Basophils Absolute: 0 10*3/uL (ref 0.0–0.1)
Basophils Relative: 0 %
EOS ABS: 0.1 10*3/uL (ref 0.0–0.7)
EOS PCT: 1 %
HCT: 33.7 % — ABNORMAL LOW (ref 39.0–52.0)
HEMOGLOBIN: 10.8 g/dL — AB (ref 13.0–17.0)
LYMPHS PCT: 23 %
Lymphs Abs: 2.1 10*3/uL (ref 0.7–4.0)
MCH: 23.1 pg — ABNORMAL LOW (ref 26.0–34.0)
MCHC: 32 g/dL (ref 30.0–36.0)
MCV: 72.2 fL — ABNORMAL LOW (ref 78.0–100.0)
MONOS PCT: 15 %
Monocytes Absolute: 1.3 10*3/uL — ABNORMAL HIGH (ref 0.1–1.0)
Neutro Abs: 5.4 10*3/uL (ref 1.7–7.7)
Neutrophils Relative %: 61 %
PLATELETS: 249 10*3/uL (ref 150–400)
RBC: 4.67 MIL/uL (ref 4.22–5.81)
RDW: 16.1 % — ABNORMAL HIGH (ref 11.5–15.5)
WBC: 8.9 10*3/uL (ref 4.0–10.5)

## 2015-12-11 MED ORDER — HYDROMORPHONE HCL 1 MG/ML IJ SOLN
1.0000 mg | Freq: Once | INTRAMUSCULAR | Status: AC
  Administered 2015-12-11: 1 mg via INTRAVENOUS
  Filled 2015-12-11: qty 1

## 2015-12-11 MED ORDER — SODIUM CHLORIDE 0.9 % IV BOLUS (SEPSIS)
1000.0000 mL | Freq: Once | INTRAVENOUS | Status: AC
  Administered 2015-12-11: 1000 mL via INTRAVENOUS

## 2015-12-11 MED ORDER — DIAZEPAM 5 MG/ML IJ SOLN
5.0000 mg | Freq: Once | INTRAMUSCULAR | Status: AC
  Administered 2015-12-11: 5 mg via INTRAMUSCULAR
  Filled 2015-12-11: qty 2

## 2015-12-11 MED ORDER — CYCLOBENZAPRINE HCL 10 MG PO TABS: 10.0000 mg | ORAL_TABLET | Freq: Two times a day (BID) | ORAL | Status: AC | PRN

## 2015-12-11 MED ORDER — HYDROMORPHONE HCL 1 MG/ML IJ SOLN: 1.0000 mg | Freq: Once | INTRAMUSCULAR | Status: DC

## 2015-12-11 NOTE — ED Notes (Signed)
Pt's oxygen sats mid-high 80s on room air while sleeping. 2L [place] placed on pt -- sats now mid- 90s-100%.

## 2015-12-11 NOTE — ED Notes (Signed)
Ambulated pt to room door and back. Reports only chronic pain in back yet states "It feels better". Pt unsteady on the way back to bed. Pt attempted to use the urinal with no results.

## 2015-12-11 NOTE — ED Notes (Signed)
Pt alert, NAD, calm, interactive, resps e/u, speech clear, no dyspnea noted, using e-tablet, VSS, states, "ready to go, OK with plan".

## 2015-12-11 NOTE — ED Notes (Signed)
Pt given d/c instructions. Rx x 1 Verbalizes understanding. No questions. 

## 2015-12-11 NOTE — ED Provider Notes (Signed)
Pt seen and evaluated.  C/o chronic Back pain.  No abd or chest pain.  No n/v/d.  Denies any other complaints.  Clear lungs, soft benign abdomen.  BP at 90 systolic, [id number] p ivf.  Cr at 1.98 before ivf.  Pt anxious to be DC'd.  Has pcp appt 4 days.  Plan DC, push fluids, cr recheck, ER with ANY changes.  Tanna Furry, MD 12/11/15 1950

## 2015-12-11 NOTE — ED Notes (Signed)
Patient reports that he is having lower back pain. The patient has a history of same

## 2015-12-11 NOTE — Discharge Instructions (Signed)
KEEP YOUR SCHEDULED FOLLOW UP APPOINTMENT WITH YOUR PCP. YOUR CREATININE WAS HIGH TODAY. YOU WILL NEED THIS RECHECKED BY YOUR DOCTOR T YOUR APPOINTMENT.    Chronic Back Pain  When back pain lasts longer than 3 months, it is called chronic back pain.People with chronic back pain often go through certain periods that are more intense (flare-ups).  CAUSES Chronic back pain can be caused by wear and tear (degeneration) on different structures in your back. These structures include:  The bones of your spine (vertebrae) and the joints surrounding your spinal cord and nerve roots (facets).  The strong, fibrous tissues that connect your vertebrae (ligaments). Degeneration of these structures may result in pressure on your nerves. This can lead to constant pain. HOME CARE INSTRUCTIONS  Avoid bending, heavy lifting, prolonged sitting, and activities which make the problem worse.  Take brief periods of rest throughout the day to reduce your pain. Lying down or standing usually is better than sitting while you are resting.  Take over-the-counter or prescription medicines only as directed by your caregiver. SEEK IMMEDIATE MEDICAL CARE IF:   You have weakness or numbness in one of your legs or feet.  You have trouble controlling your bladder or bowels.  You have nausea, vomiting, abdominal pain, shortness of breath, or fainting.   This information is not intended to replace advice given to you by your health care provider. Make sure you discuss any questions you have with your health care provider.   Document Released: 08/31/2004 Document Revised: 10/16/2011 Document Reviewed: 01/11/2015 Elsevier Interactive Patient Education [employer].

## 2015-12-11 NOTE — ED Provider Notes (Signed)
CSN: [id number]     Arrival date & time 12/11/15  1525 History   First MD Initiated Contact with Patient 12/11/15 1610     Chief Complaint  Patient presents with  . Back Pain   HPI  Mr. Alana is a 65 year old male with past medical history of hypertension, chronic abdominal pain due to pancreatitis, diabetes, chronic back pain presenting with back pain. Patient states he was fishing with his nephew when he had acute worsening of his chronic back pain. He states that his right leg felt like was going to give out beneath him. His nephew had to help him ambulate to the car and he was driven to the emergency department by his son. He states the pain he is experiencing today is similar to past episodes of back pain. Pain is located over the right lumbar region. The pain is severe and aching and shoots down the right leg into his knee. States that the pain is so severe that he feels weak in his leg. The pain is constant and exacerbated by movement of the right leg. He wears a fentanyl patch which he reports has not improved his pain. He was seen on 4/28 for chronic pain by his PCP who refilled his prescription for fentanyl patches and gave him trigger point injection. Denies fevers, chills, abdominal pain, nausea, vomiting, bowel/bladder incontinence, numbness of the lower extremities, saddle anesthesia or any other complaints today. No new trauma to the back.   Chart review: pt seen multiple times by this department and OSH for chronic back pain. Similar presentations of right lumbar back pain with radiculopathy each time.   Past Medical History  Diagnosis Date  . Hypertension   . Pancreatitis   . Myocardial infarct ([place])   . Diabetes mellitus without complication ([place])   . Chronic back pain   . Thyroid cancer ([place])   . Stroke [facility])     multiple in sept and oct 2013   Past Surgical History  Procedure Laterality Date  . Thyroidectomy    . Pancreas surgery    . Knee surgery    . Lumbar epidural  injection    . Angioplasty     History reviewed. No pertinent family history. Social History  Substance Use Topics  . Smoking status: Former Smoker    Quit date: 06/08/2012  . Smokeless tobacco: Never Used  . Alcohol Use: No    Review of Systems  All other systems reviewed and are negative.     Allergies  Ivp dye and Spinach  Home Medications   Prior to Admission medications   Medication Sig Start Date End Date Taking? Authorizing Provider  amLODipine (NORVASC) 10 MG tablet Take 10 mg by mouth every morning.     Historical Provider, MD  aspirin 325 MG EC tablet Take 81 mg by mouth every morning.     Historical Provider, MD  atenolol (TENORMIN) 100 MG tablet Take 100 mg by mouth every morning.     Historical Provider, MD  atorvastatin (LIPITOR) 20 MG tablet Take 20 mg by mouth at bedtime.      Historical Provider, MD  baclofen (LIORESAL) 10 MG tablet Take 1 tablet (10 mg total) by mouth 3 (three) times daily. 11/28/13   [name], PA-C  bisacodyl (DULCOLAX) 5 MG EC tablet Take 1 tablet (5 mg total) by mouth daily. 12/19/14   [name], MD  clopidogrel (PLAVIX) 75 MG tablet Take 75 mg by mouth every morning.     Historical  Provider, MD  cyclobenzaprine (FLEXERIL) 10 MG tablet Take 1 tablet (10 mg total) by mouth 2 (two) times daily as needed for muscle spasms. 05/08/15   [name], MD  cyclobenzaprine (FLEXERIL) 10 MG tablet Take 1 tablet (10 mg total) by mouth 2 (two) times daily as needed for muscle spasms. 12/11/15   Carmell Elgin, PA-C  Diazepam (VALIUM PO) Take by mouth.    Historical Provider, MD  esomeprazole (NEXIUM) 40 MG capsule Take 40 mg by mouth daily before breakfast.      Historical Provider, MD  fentaNYL (DURAGESIC - DOSED MCG/HR) 75 MCG/HR Place 1 patch onto the skin every 3 (three) days.    Historical Provider, MD  furosemide (LASIX) 40 MG tablet Take 40 mg by mouth every morning.     Historical Provider, MD  gabapentin (NEURONTIN) 600 MG tablet Take  900 mg by mouth 3 (three) times daily as needed. Nerve pain    Historical Provider, MD  HYDROcodone-acetaminophen (NORCO/VICODIN) 5-325 MG tablet Take 1-2 tablets by mouth every 6 (six) hours as needed. 07/14/15   [name], MD  hydrocortisone 2.5 % cream Apply topically 2 (two) times daily as needed. Hemorids     Historical Provider, MD  isosorbide mononitrate (IMDUR) 30 MG 24 hr tablet Take 15 mg by mouth every morning.     Historical Provider, MD  levothyroxine (SYNTHROID, LEVOTHROID) 300 MCG tablet Take 300 mcg by mouth daily.      Historical Provider, MD  metFORMIN (GLUCOPHAGE) 500 MG tablet Take 500 mg by mouth 2 (two) times daily with a meal.    Historical Provider, MD  nitroGLYCERIN (NITROSTAT) 0.4 MG SL tablet Place 0.4 mg under the tongue every 5 (five) minutes x 3 doses as needed. Chest pain    Historical Provider, MD  ondansetron (ZOFRAN) 4 MG tablet Take 4 mg by mouth every 8 (eight) hours as needed for nausea.    Historical Provider, MD  ondansetron (ZOFRAN) 4 MG tablet Take 1 tablet (4 mg total) by mouth every 6 (six) hours. 09/18/13   [name], MD  ondansetron (ZOFRAN-ODT) 8 MG disintegrating tablet Take 0.5-1 tablets (4-8 mg total) by mouth every 8 (eight) hours as needed for nausea or vomiting. 4mg  ODT q4 hours prn nausea/vomit 11/25/14   [name], PA-C  Pancrelipase, Lip-Prot-Amyl, (ZENPEP) 5000 UNITS CPEP Take 3 capsules by mouth at bedtime.      Historical Provider, MD  promethazine (PHENERGAN) 25 MG suppository Place 1 suppository (25 mg total) rectally every 6 (six) hours as needed for nausea. 10/17/14   [name], MD  SERTRALINE HCL PO Take by mouth at bedtime.    Historical Provider, MD   BP 101/51 mmHg  Pulse 64  Temp(Src) 99.2 F (37.3 C)  Resp 15  Ht 6\' 1"  (1.854 m)  Wt 93.441 kg  BMI 27.18 kg/m2  SpO2 90% Physical Exam  Constitutional: He appears well-developed and well-nourished. No distress.  Nontoxic appearing  HENT:  Head: Normocephalic  and atraumatic.  Mouth/Throat: Oropharynx is clear and moist. No oropharyngeal exudate.  Eyes: Conjunctivae are normal. Right eye exhibits no discharge. Left eye exhibits no discharge. No scleral icterus.  Neck: Normal range of motion. Neck supple.  Cardiovascular: Normal rate, regular rhythm, normal heart sounds and intact distal pulses.   Pedal pulses palpable  Pulmonary/Chest: Effort normal and breath sounds normal. No respiratory distress. He has no wheezes. He has no rales.  Abdominal: Soft. He exhibits no distension. There is no tenderness.  Musculoskeletal: Normal  range of motion.       Lumbar back: He exhibits tenderness. He exhibits no bony tenderness and no deformity.       Back:  TTP of right lumbar region. No focal tenderness of L spine. Pt has restricted ROM secondary to pain but is able to sit forward without assistance. Restricted ROM of the right leg secondary to pain; pt is able to lift leg off the bed. Dorsiflexion and plantarflexion of ankle intact. Positive straight leg raise.   Neurological: He is alert. Coordination normal.  5/5 ankle strength bilaterally. Sensation to light touch intact over the lower extremities  Skin: Skin is warm and dry.  Psychiatric: He has a normal mood and affect. His behavior is normal.  Nursing note and vitals reviewed.   ED Course  Procedures (including critical care time) Labs Review Labs Reviewed  CBC WITH DIFFERENTIAL/PLATELET - Abnormal; Notable for the following:    Hemoglobin 10.8 (*)    HCT 33.7 (*)    MCV 72.2 (*)    MCH 23.1 (*)    RDW 16.1 (*)    Monocytes Absolute 1.3 (*)    All other components within normal limits  BASIC METABOLIC PANEL - Abnormal; Notable for the following:    BUN 32 (*)    Creatinine, Ser 1.94 (*)    Calcium 8.1 (*)    GFR calc non Af Amer 35 (*)    GFR calc Af Amer 41 (*)    Anion gap 4 (*)    All other components within normal limits    Imaging Review No results found. I have personally  reviewed and evaluated these images and lab results as part of my medical decision-making.   EKG Interpretation None      MDM   Final diagnoses:  Chronic back pain   65 year old male with PMHx of chronic back pain presenting with acute worsening of back pain. Afebrile. Pt noted to be mildly hypotensive at 94/56. Pt denies symptoms of dizziness, lightheadedness, syncope, chest pain, SOB. Given fluid bolus which improved BP to 109/60. While in ED, pt fell asleep and noted to drop O2 to low 90s on RA. Re-assessed pt and upon waking, O2 increased to 98%. Non-focal neuro exam. TTP of right lumbar region. Positive straight leg raise. Patient is able to ambulate though with some discomfort after receiving valium and dilaudid. Denies lightheadedness or dizziness on ambulating. No loss of bowel or bladder control. No numbness or weakness in the lower extremities. No concern for cauda equina. No history of IVDU or cancer. No back pain red flags. Hgb of 10.8 noted to be slightly lower than baseline of 12. Creatinine elevated to 1.94 with no record of previous elevations. Pt denies recent viral illness with nausea, vomiting or diarrhea. Given fluid bolus for possibly dehydration. Pt reports feeling good and requests discharge home. Pt has follow up appt in 3 days with his PCP. Pt has fentanyl patch at home; will not provide further narcotics. Will rx short course of muscle relaxers. Return precautions given in discharge paperwork and discussed with pt at bedside. Pt stable for discharge     Josephina Gip, PA-C 12/11/15 2053  Tanna Furry, MD 12/13/15 [phone]

## 2015-12-22 ENCOUNTER — Emergency Department (HOSPITAL_BASED_OUTPATIENT_CLINIC_OR_DEPARTMENT_OTHER)
Admission: EM | Admit: 2015-12-22 | Discharge: 2015-12-22 | Disposition: A | Discharge status: Home / Self Care | Payer: Medicare HMO | Attending: Emergency Medicine | Admitting: Emergency Medicine

## 2015-12-22 ENCOUNTER — Encounter (HOSPITAL_BASED_OUTPATIENT_CLINIC_OR_DEPARTMENT_OTHER): Payer: Self-pay | Admitting: Emergency Medicine

## 2015-12-22 ENCOUNTER — Emergency Department (HOSPITAL_BASED_OUTPATIENT_CLINIC_OR_DEPARTMENT_OTHER): Payer: Medicare HMO

## 2015-12-22 DIAGNOSIS — Y999 Unspecified external cause status: Secondary | ICD-10-CM | POA: Insufficient documentation

## 2015-12-22 DIAGNOSIS — W19XXXA Unspecified fall, initial encounter: Secondary | ICD-10-CM | POA: Insufficient documentation

## 2015-12-22 DIAGNOSIS — Y929 Unspecified place or not applicable: Secondary | ICD-10-CM | POA: Insufficient documentation

## 2015-12-22 DIAGNOSIS — I252 Old myocardial infarction: Secondary | ICD-10-CM | POA: Insufficient documentation

## 2015-12-22 DIAGNOSIS — I10 Essential (primary) hypertension: Secondary | ICD-10-CM | POA: Insufficient documentation

## 2015-12-22 DIAGNOSIS — Y939 Activity, unspecified: Secondary | ICD-10-CM | POA: Diagnosis not present

## 2015-12-22 DIAGNOSIS — E119 Type 2 diabetes mellitus without complications: Secondary | ICD-10-CM | POA: Diagnosis not present

## 2015-12-22 DIAGNOSIS — Z85850 Personal history of malignant neoplasm of thyroid: Secondary | ICD-10-CM | POA: Diagnosis not present

## 2015-12-22 DIAGNOSIS — S7001XA Contusion of right hip, initial encounter: Secondary | ICD-10-CM | POA: Diagnosis not present

## 2015-12-22 DIAGNOSIS — S8001XA Contusion of right knee, initial encounter: Secondary | ICD-10-CM | POA: Diagnosis not present

## 2015-12-22 DIAGNOSIS — Z87891 Personal history of nicotine dependence: Secondary | ICD-10-CM | POA: Diagnosis not present

## 2015-12-22 DIAGNOSIS — S300XXA Contusion of lower back and pelvis, initial encounter: Secondary | ICD-10-CM | POA: Diagnosis not present

## 2015-12-22 DIAGNOSIS — Z7984 Long term (current) use of oral hypoglycemic drugs: Secondary | ICD-10-CM | POA: Insufficient documentation

## 2015-12-22 DIAGNOSIS — S3992XA Unspecified injury of lower back, initial encounter: Secondary | ICD-10-CM | POA: Diagnosis present

## 2015-12-22 DIAGNOSIS — Z8673 Personal history of transient ischemic attack (TIA), and cerebral infarction without residual deficits: Secondary | ICD-10-CM | POA: Insufficient documentation

## 2015-12-22 MED ORDER — HYDROCODONE-ACETAMINOPHEN 5-325 MG PO TABS: 1.0000 | ORAL_TABLET | Freq: Four times a day (QID) | ORAL | Status: AC | PRN

## 2015-12-22 NOTE — ED Notes (Signed)
Pt in c/o pain after fall yesterday. States pain to back and R side. Ambulatory to triage in NAD.

## 2015-12-22 NOTE — ED Provider Notes (Signed)
CSN: [id number]     Arrival date & time 12/22/15  1855 History  By signing my name below, I, [facility], attest that this documentation has been prepared under the direction and in the presence of Veryl Speak, MD. Electronically Signed: Virgel Bouquet, ED Scribe. 12/22/2015. 10:04 PM.   Chief Complaint  Patient presents with  . Fall   HPI Comments: Robert Meadows is a 65 y.o. male who presents to the Emergency Department complaining of constant, mild right leg, right knee, right hip, and back pain onset yesterday after a fall. Pt states that he fell backwards onto his right side, rolled over to the left, and stood up, followed by pain. He has been able to ambulate without difficulty despite pain. Pain is worse with movement and weight bearing. He takes aspirin regularly. Denies edema and any other symptoms currently.   Patient is a 65 y.o. male presenting with fall. The history is provided by the patient. No language interpreter was used.  Fall This is a new problem. The current episode started yesterday. The problem occurs rarely. The problem has been resolved. Nothing aggravates the symptoms. Nothing relieves the symptoms. He has tried nothing for the symptoms.    Past Medical History  Diagnosis Date  . Hypertension   . Pancreatitis   . Myocardial infarct ([place])   . Diabetes mellitus without complication ([place])   . Chronic back pain   . Thyroid cancer ([place])   . Stroke [facility])     multiple in sept and oct 2013   Past Surgical History  Procedure Laterality Date  . Thyroidectomy    . Pancreas surgery    . Knee surgery    . Lumbar epidural injection    . Angioplasty     History reviewed. No pertinent family history. Social History  Substance Use Topics  . Smoking status: Former Smoker    Quit date: 06/08/2012  . Smokeless tobacco: Never Used  . Alcohol Use: No    Review of Systems  Musculoskeletal: Positive for myalgias and arthralgias. Negative for joint  swelling.  All other systems reviewed and are negative.     Allergies  Ivp dye and Spinach  Home Medications   Prior to Admission medications   Medication Sig Start Date End Date Taking? Authorizing Provider  amLODipine (NORVASC) 10 MG tablet Take 10 mg by mouth every morning.     Historical Provider, MD  aspirin 325 MG EC tablet Take 81 mg by mouth every morning.     Historical Provider, MD  atenolol (TENORMIN) 100 MG tablet Take 100 mg by mouth every morning.     Historical Provider, MD  atorvastatin (LIPITOR) 20 MG tablet Take 20 mg by mouth at bedtime.      Historical Provider, MD  baclofen (LIORESAL) 10 MG tablet Take 1 tablet (10 mg total) by mouth 3 (three) times daily. 11/28/13   [name], PA-C  bisacodyl (DULCOLAX) 5 MG EC tablet Take 1 tablet (5 mg total) by mouth daily. 12/19/14   [name], MD  clopidogrel (PLAVIX) 75 MG tablet Take 75 mg by mouth every morning.     Historical Provider, MD  cyclobenzaprine (FLEXERIL) 10 MG tablet Take 1 tablet (10 mg total) by mouth 2 (two) times daily as needed for muscle spasms. 05/08/15   [name], MD  cyclobenzaprine (FLEXERIL) 10 MG tablet Take 1 tablet (10 mg total) by mouth 2 (two) times daily as needed for muscle spasms. 12/11/15   [name], PA-C  Diazepam (  VALIUM PO) Take by mouth.    Historical Provider, MD  esomeprazole (NEXIUM) 40 MG capsule Take 40 mg by mouth daily before breakfast.      Historical Provider, MD  fentaNYL (DURAGESIC - DOSED MCG/HR) 75 MCG/HR Place 1 patch onto the skin every 3 (three) days.    Historical Provider, MD  furosemide (LASIX) 40 MG tablet Take 40 mg by mouth every morning.     Historical Provider, MD  gabapentin (NEURONTIN) 600 MG tablet Take 900 mg by mouth 3 (three) times daily as needed. Nerve pain    Historical Provider, MD  HYDROcodone-acetaminophen (NORCO/VICODIN) 5-325 MG tablet Take 1-2 tablets by mouth every 6 (six) hours as needed. 07/14/15   [name], MD   hydrocortisone 2.5 % cream Apply topically 2 (two) times daily as needed. Hemorids     Historical Provider, MD  isosorbide mononitrate (IMDUR) 30 MG 24 hr tablet Take 15 mg by mouth every morning.     Historical Provider, MD  levothyroxine (SYNTHROID, LEVOTHROID) 300 MCG tablet Take 300 mcg by mouth daily.      Historical Provider, MD  metFORMIN (GLUCOPHAGE) 500 MG tablet Take 500 mg by mouth 2 (two) times daily with a meal.    Historical Provider, MD  nitroGLYCERIN (NITROSTAT) 0.4 MG SL tablet Place 0.4 mg under the tongue every 5 (five) minutes x 3 doses as needed. Chest pain    Historical Provider, MD  ondansetron (ZOFRAN) 4 MG tablet Take 4 mg by mouth every 8 (eight) hours as needed for nausea.    Historical Provider, MD  ondansetron (ZOFRAN) 4 MG tablet Take 1 tablet (4 mg total) by mouth every 6 (six) hours. 09/18/13   [name], MD  ondansetron (ZOFRAN-ODT) 8 MG disintegrating tablet Take 0.5-1 tablets (4-8 mg total) by mouth every 8 (eight) hours as needed for nausea or vomiting. 4mg  ODT q4 hours prn nausea/vomit 11/25/14   [name], PA-C  Pancrelipase, Lip-Prot-Amyl, (ZENPEP) 5000 UNITS CPEP Take 3 capsules by mouth at bedtime.      Historical Provider, MD  promethazine (PHENERGAN) 25 MG suppository Place 1 suppository (25 mg total) rectally every 6 (six) hours as needed for nausea. 10/17/14   [name], MD  SERTRALINE HCL PO Take by mouth at bedtime.    Historical Provider, MD   BP 121/70 mmHg  Pulse 72  Temp(Src) 98.5 F (36.9 C) (Oral)  Resp 20  Ht 6\' 1"  (1.854 m)  Wt 226 lb (102.513 kg)  BMI 29.82 kg/m2  SpO2 97% Physical Exam  Constitutional: He is oriented to person, place, and time. He appears well-developed and well-nourished.  HENT:  Head: Normocephalic and atraumatic.  Eyes: EOM are normal.  Neck: Normal range of motion.  Cardiovascular: Normal rate, regular rhythm, normal heart sounds and intact distal pulses.   Pulmonary/Chest: Effort normal and  breath sounds normal. No respiratory distress.  Abdominal: Soft. He exhibits no distension. There is no tenderness.  Musculoskeletal: Normal range of motion. He exhibits tenderness. He exhibits no edema.  TTP in the paraspinal musculature of the right l-spine region. No bony tenderness or step-offs. Right knee appears grossly normal without effusion. Good ROM of right knee.  Neurological: He is alert and oriented to person, place, and time.  Skin: Skin is warm and dry.  Psychiatric: He has a normal mood and affect. Judgment normal.  Nursing note and vitals reviewed.   ED Course  Procedures   DIAGNOSTIC STUDIES: Oxygen Saturation is 97% on RA, normal by my  interpretation.    COORDINATION OF CARE: 8:45 PM Will order x-rays of right knee and right hip. Discussed treatment plan with pt at bedside and pt agreed to plan.   Labs Review Labs Reviewed - No data to display  Imaging Review Dg Lumbar Spine Complete  12/22/2015  CLINICAL DATA:  [place] yesterday, RIGHT flank and RIGHT lateral hip pain EXAM: LUMBAR SPINE - COMPLETE 4+ VIEW COMPARISON:  CT abdomen pelvis 03/06/2015 FINDINGS: 5 non-rib-bearing lumbar vertebra. Bones appear demineralized. Multilevel disc space narrowing and endplate spur formation of lower thoracic and lumbar spine. Greatest disc space narrowing present at L4-L5. Facet degenerative changes lower lumbar spine. No acute fracture, subluxation or bone destruction. No spondylolysis. SI joints symmetric. 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 No acute abnormalities. Electronically Signed   By: Lavonia Dana M.D.   On: 12/22/2015 22:00   Dg Knee Complete 4 Views Right  12/22/2015  CLINICAL DATA:  [place] yesterday, anterior medial RIGHT knee pain EXAM: RIGHT KNEE - COMPLETE 4+ VIEW COMPARISON:  None FINDINGS: Osseous mineralization normal. Joint spaces preserved. No acute fracture, dislocation or bone destruction. No knee joint effusion. IMPRESSION: No  acute osseous abnormalities. Electronically Signed   By: Lavonia Dana M.D.   On: 12/22/2015 22:01   Dg Hip Unilat With Pelvis 2-3 Views Right  12/22/2015  ADDENDUM REPORT: 12/22/2015 22:00 ADDENDUM: With additional review, a questionable fracture is identified at the lateral margin of the superior RIGHT pubic ramus. No additional pelvic fracture identified. Electronically Signed   By: Lavonia Dana M.D.   On: 12/22/2015 22:00  12/22/2015  CLINICAL DATA:  Lateral RIGHT hip pain, fell yesterday, initial encounter EXAM: DG HIP (WITH OR WITHOUT PELVIS) 2-3V RIGHT COMPARISON:  None FINDINGS: New osseous demineralization. Minimal narrowing of the hip joints. SI joints grossly preserved. No acute fracture, dislocation, or bone destruction. Mild pelvic enthesopathy. Degenerative changes at visualized lower lumbar spine. IMPRESSION: Osseous demineralization with minimal joint space narrowing of both hips. No acute abnormalities. Electronically Signed: By: Lavonia Dana M.D. On: 12/22/2015 21:48   I have personally reviewed and evaluated these images and lab results as part of my medical decision-making.    MDM   Final diagnoses:  None    X-rays are negative for fracture or dislocation. He is neurologically intact. He will be discharged with pain medication and when necessary follow-up.  I personally performed the services described in this documentation, which was scribed in my presence. The recorded information has been reviewed and is accurate.       Veryl Speak, MD 12/22/15 2222

## 2015-12-22 NOTE — ED Notes (Signed)
MD at bedside discussing test results and dispo plan of care. 

## 2015-12-22 NOTE — ED Notes (Signed)
MD at bedside. 

## 2015-12-22 NOTE — Discharge Instructions (Signed)
Hydrocodone as prescribed as needed for pain.  Follow-up with your primary Dr. if not improving in the next week.   Contusion A contusion is a deep bruise. Contusions are the result of a blunt injury to tissues and muscle fibers under the skin. The injury causes bleeding under the skin. The skin overlying the contusion may turn blue, purple, or yellow. Minor injuries will give you a painless contusion, but more severe contusions may stay painful and swollen for a few weeks.  CAUSES  This condition is usually caused by a blow, trauma, or direct force to an area of the body. SYMPTOMS  Symptoms of this condition include:  Swelling of the injured area.  Pain and tenderness in the injured area.  Discoloration. The area may have redness and then turn blue, purple, or yellow. DIAGNOSIS  This condition is diagnosed based on a physical exam and medical history. An X-ray, CT scan, or MRI may be needed to determine if there are any associated injuries, such as broken bones (fractures). TREATMENT  Specific treatment for this condition depends on what area of the body was injured. In general, the best treatment for a contusion is resting, icing, applying pressure to (compression), and elevating the injured area. This is often called the RICE strategy. Over-the-counter anti-inflammatory medicines may also be recommended for pain control.  HOME CARE INSTRUCTIONS   Rest the injured area.  If directed, apply ice to the injured area:  Put ice in a plastic bag.  Place a towel between your skin and the bag.  Leave the ice on for 20 minutes, 2-3 times per day.  If directed, apply light compression to the injured area using an elastic bandage. Make sure the bandage is not wrapped too tightly. Remove and reapply the bandage as directed by your health care provider.  If possible, raise (elevate) the injured area above the level of your heart while you are sitting or lying down.  Take over-the-counter  and prescription medicines only as told by your health care provider. SEEK MEDICAL CARE IF:  Your symptoms do not improve after several days of treatment.  Your symptoms get worse.  You have difficulty moving the injured area. SEEK IMMEDIATE MEDICAL CARE IF:   You have severe pain.  You have numbness in a hand or foot.  Your hand or foot turns pale or cold.   This information is not intended to replace advice given to you by your health care provider. Make sure you discuss any questions you have with your health care provider.   Document Released: 05/03/2005 Document Revised: 04/14/2015 Document Reviewed: 12/09/2014 Elsevier Interactive Patient Education [employer].

## 2016-03-15 IMAGING — DX DG LUMBAR SPINE COMPLETE 4+V
5 series · 5 of 5 positions shown · non-contrast
Comparison: CT 02/19/2014

CLINICAL DATA: lower back pain, specifically the lower lumbar
region. Jesus Damian this has been ongoing for 13 years but bad enough today
to have it looked it. Denies injury or surgery to area.

EXAM:
LUMBAR SPINE - COMPLETE 4+ VIEW

[l-spine ap]
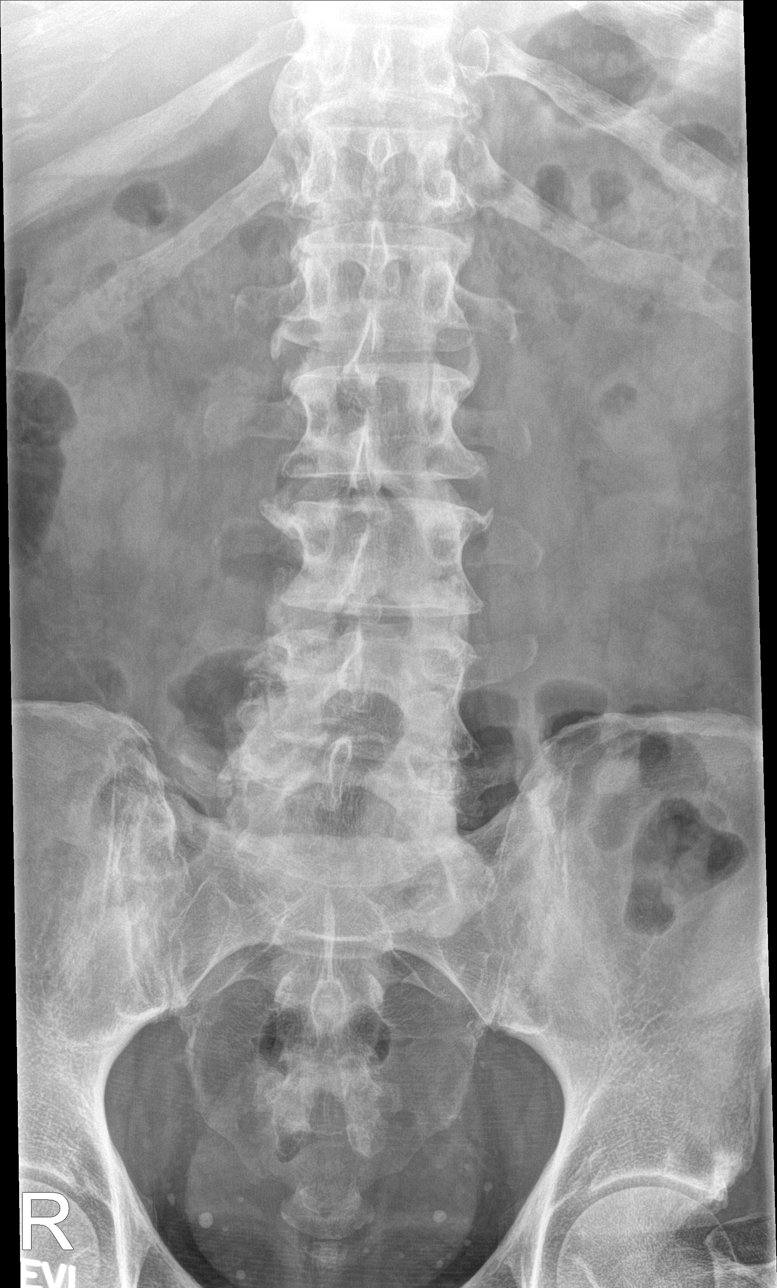

[l-spine obl (1 of 2)]
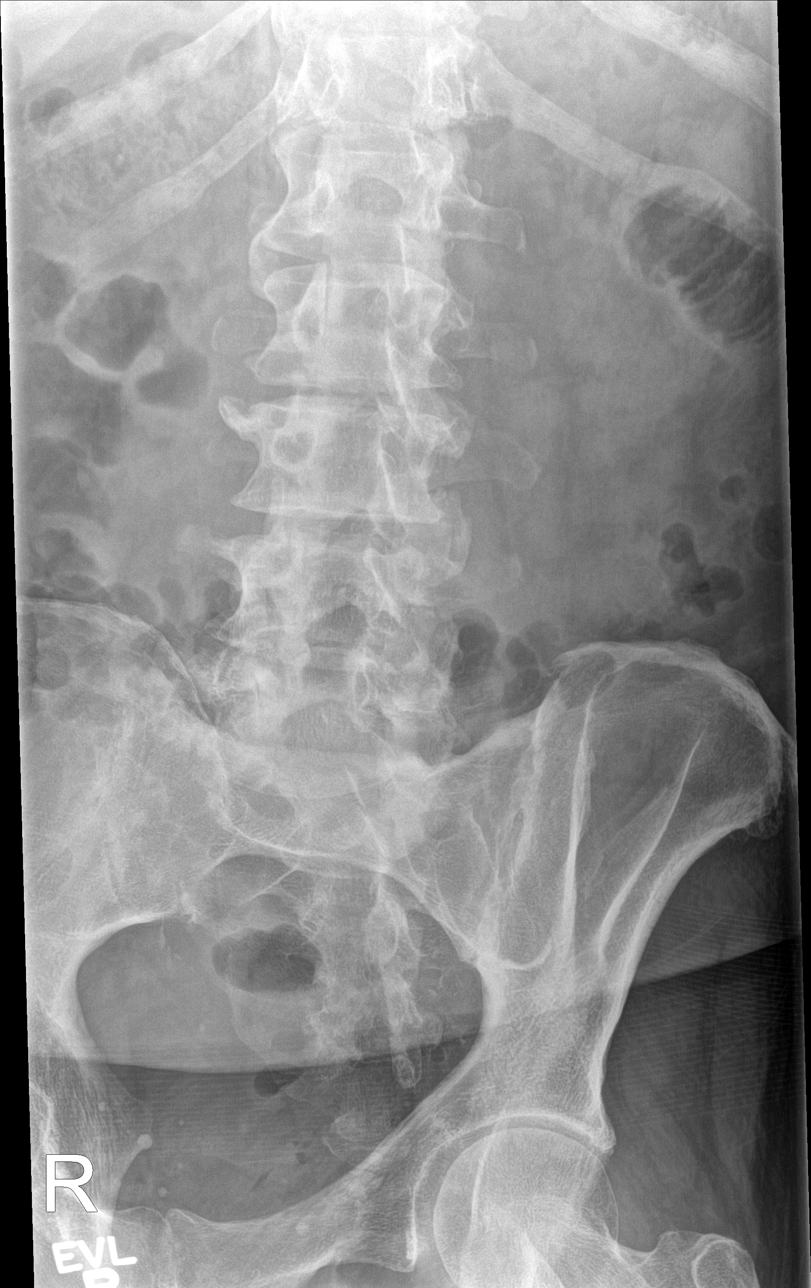

[l-spine obl (2 of 2)]
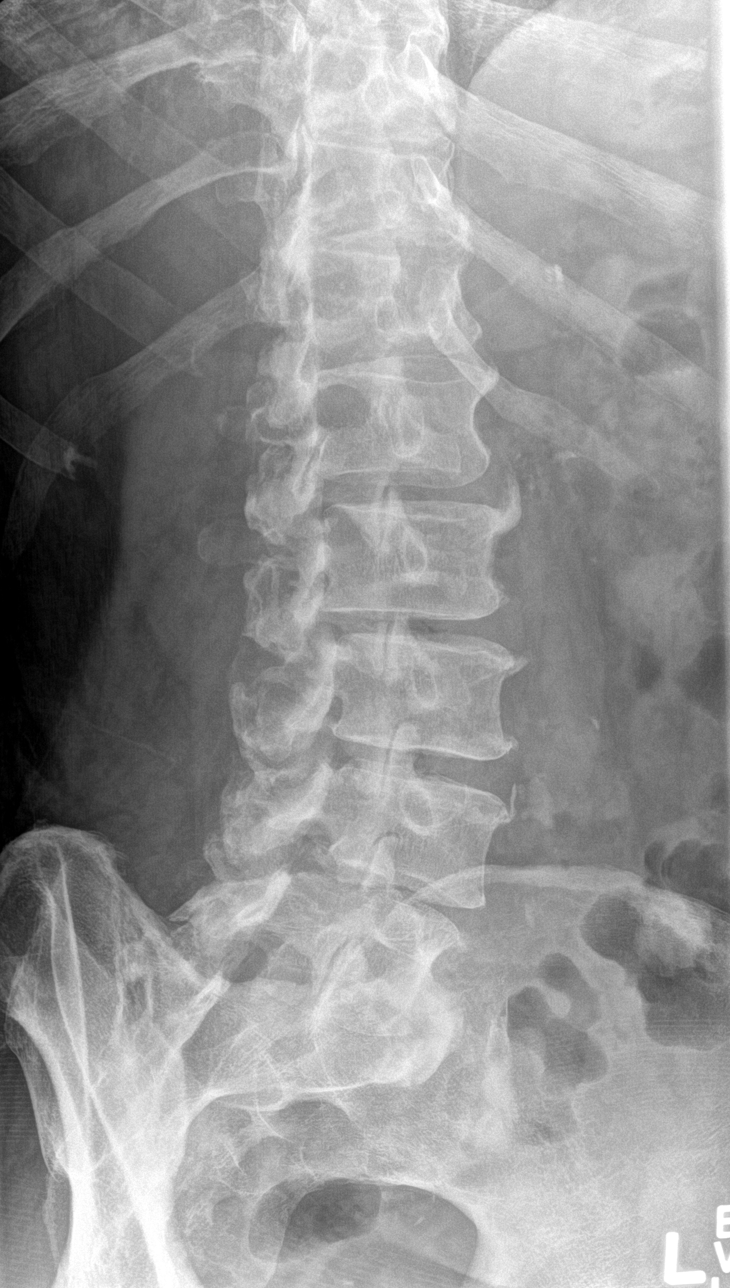

[l-spine lat]
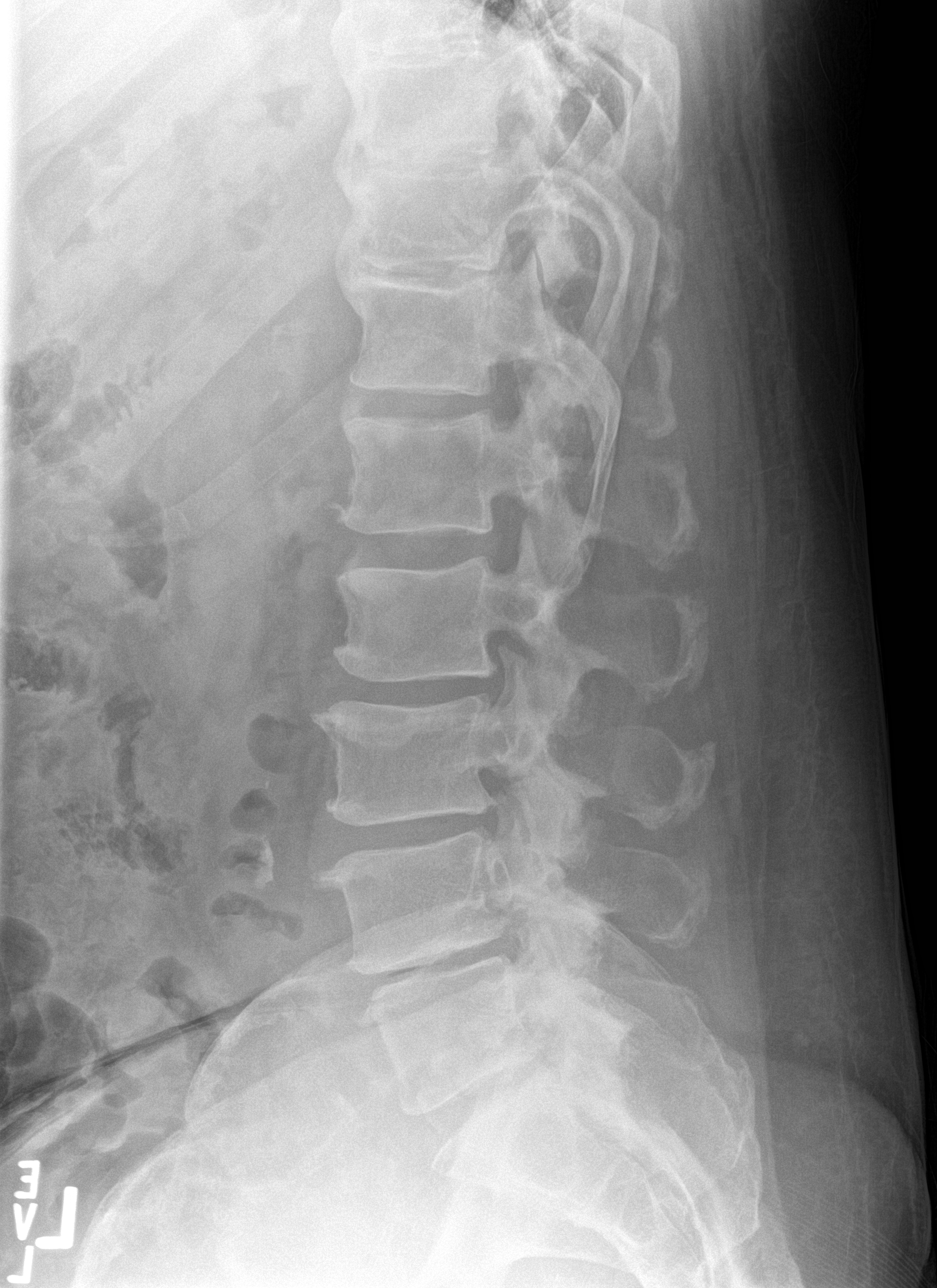

[l-spine spot]
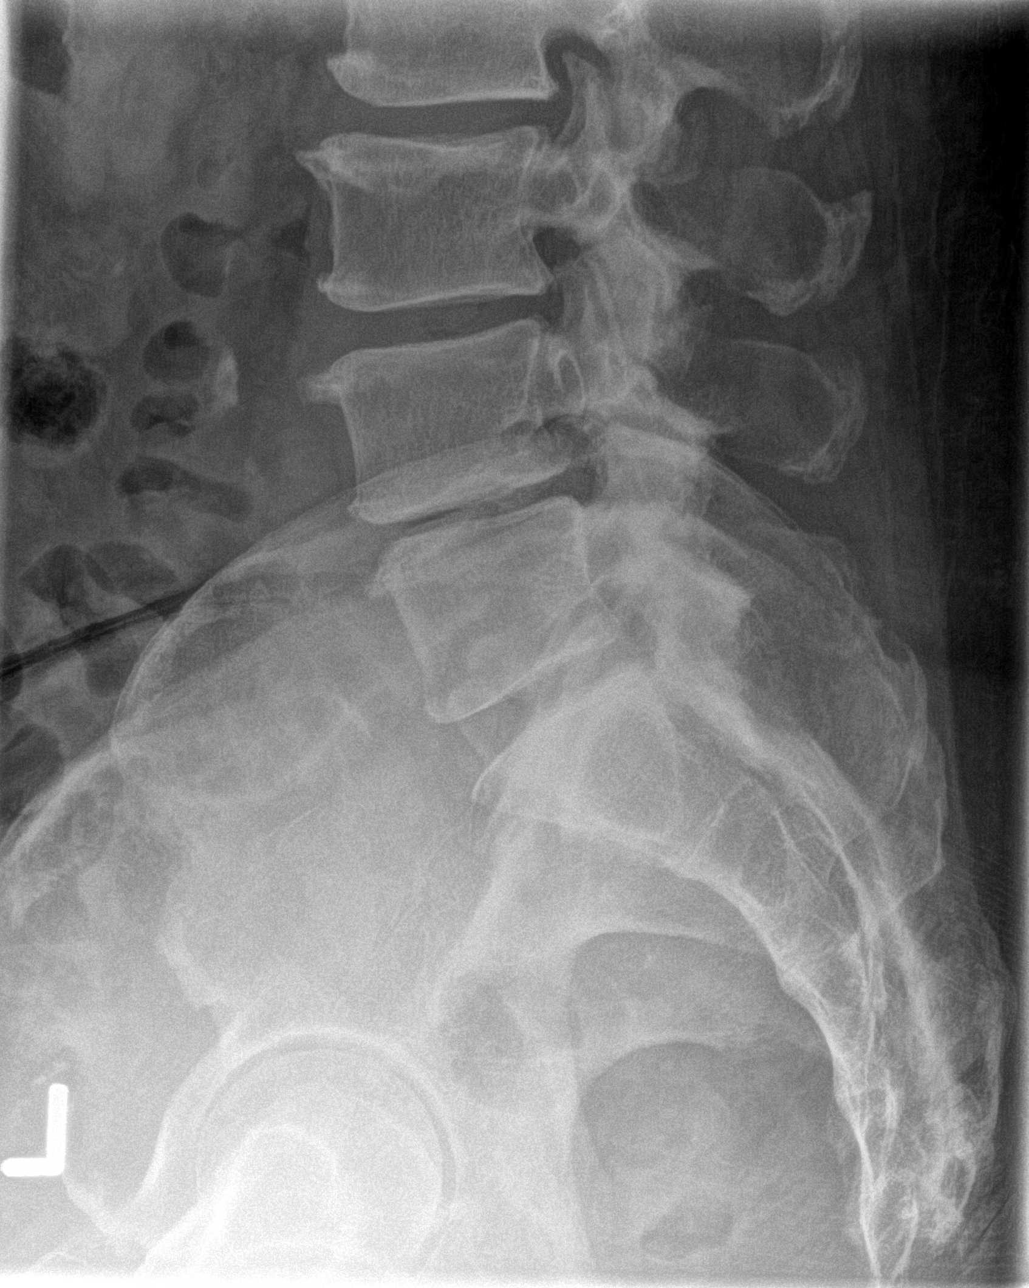

[5 of 5 positions shown; findings below may reference images not displayed]

FINDINGS: Bilateral facet DJD L4-5 and L5-S1. Small anterior endplate spurs at
all lumbar levels. There is mild narrowing of the interspaces at
L2-3 and L3-4, moderate narrowing L4-5. Normal alignment. Bridging
osteophytes in the visualized lower thoracic spine. Negative for
fracture. Patchy aortic calcifications.
IMPRESSION: 1. Negative for fracture or other acute bone abnormality.
2. Multilevel degenerative changes as above

## 2016-04-23 IMAGING — CR DG CHEST 1V PORT
1 series · 1 of 1 positions shown · non-contrast
Comparison: July 15, 2014.

CLINICAL DATA: Epigastric pain, nausea, vomiting.

EXAM:
PORTABLE CHEST - 1 VIEW

[view not recorded]
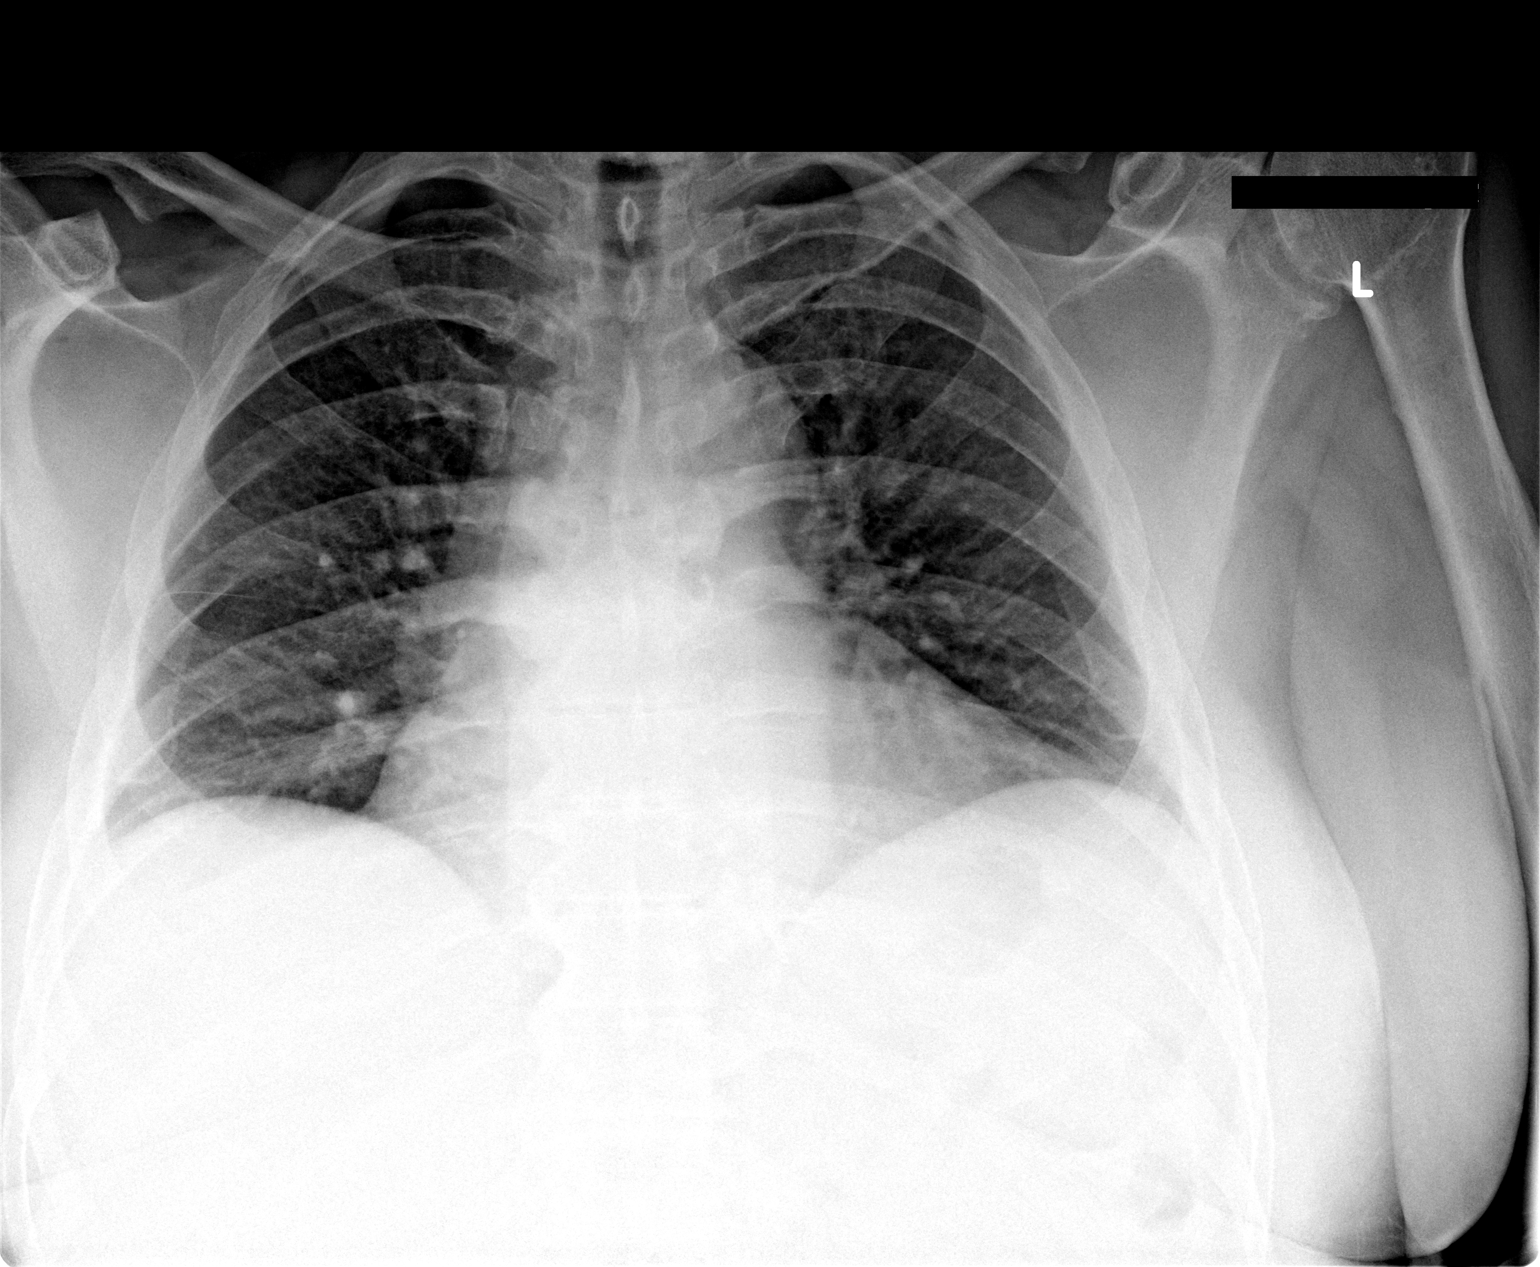

[1 of 1 positions shown; findings below may reference images not displayed]

FINDINGS: Stable cardiomediastinal silhouette. Both lungs are clear. No
pneumothorax or pleural effusion is noted. The visualized skeletal
structures are unremarkable.
IMPRESSION: No acute cardiopulmonary abnormality seen.

## 2016-05-17 IMAGING — CR DG ABDOMEN ACUTE W/ 1V CHEST
3 series · 3 of 3 positions shown · non-contrast
Comparison: 11/25/2014; 07/15/2014; 02/19/2014

CLINICAL DATA: Generalized abdominal pain, nausea and vomiting.
History of pancreatitis.

EXAM:
DG ABDOMEN ACUTE W/ 1V CHEST

[w chest pa]
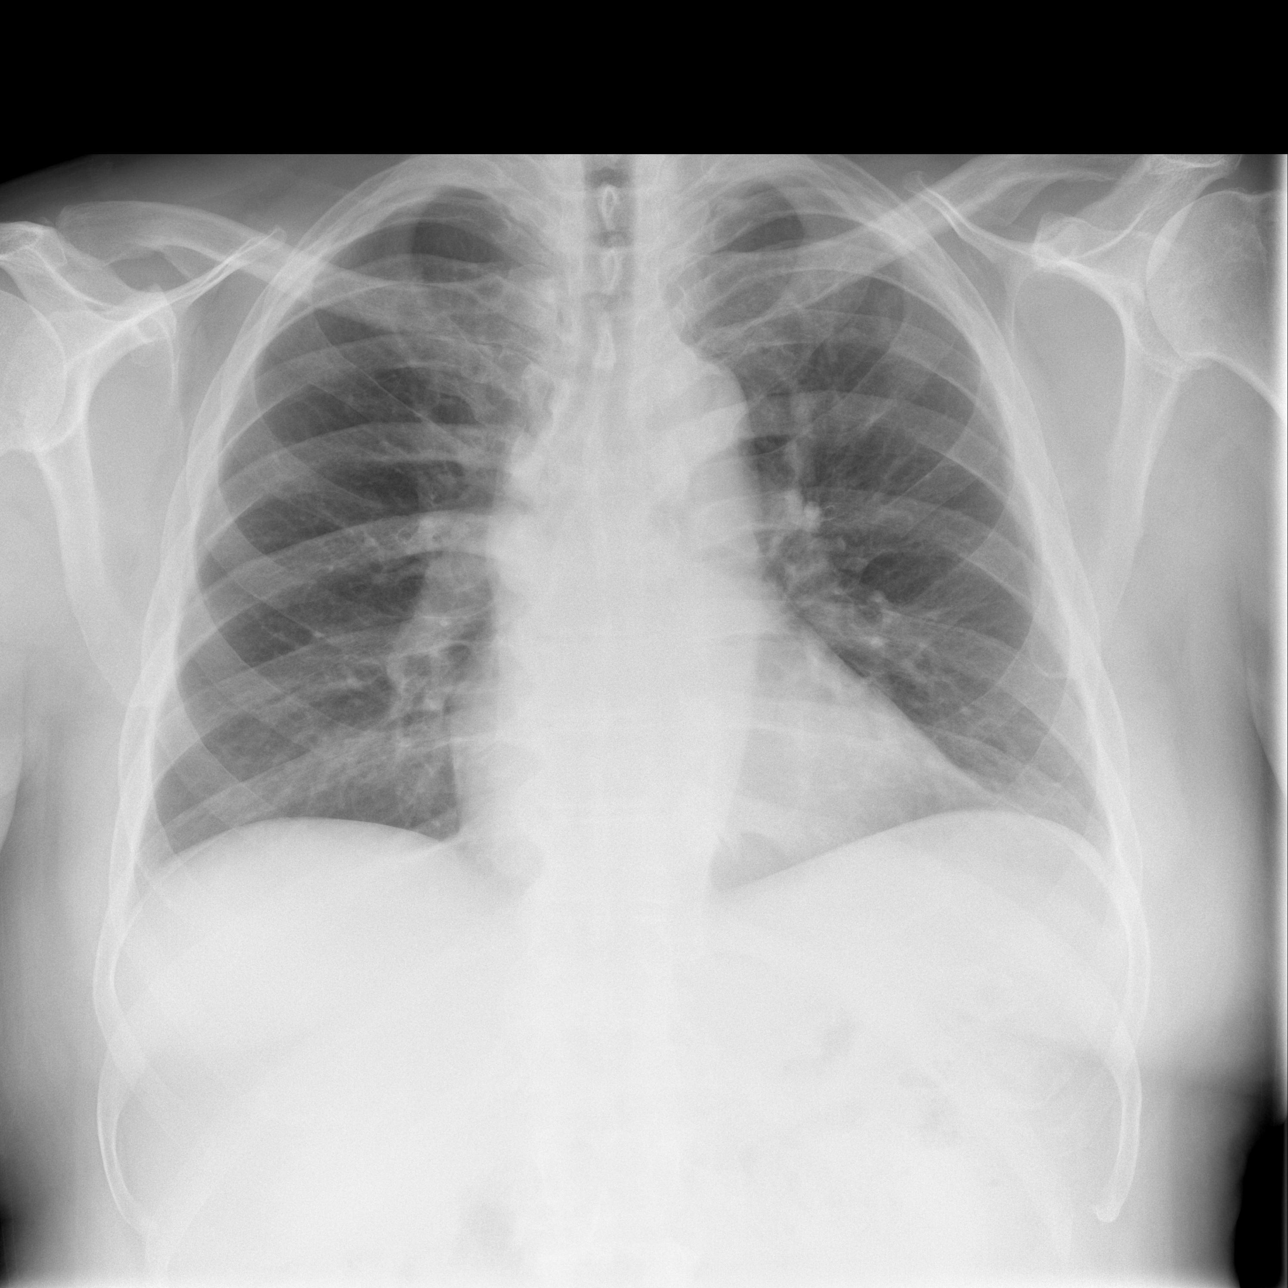

[w abdomen upright]
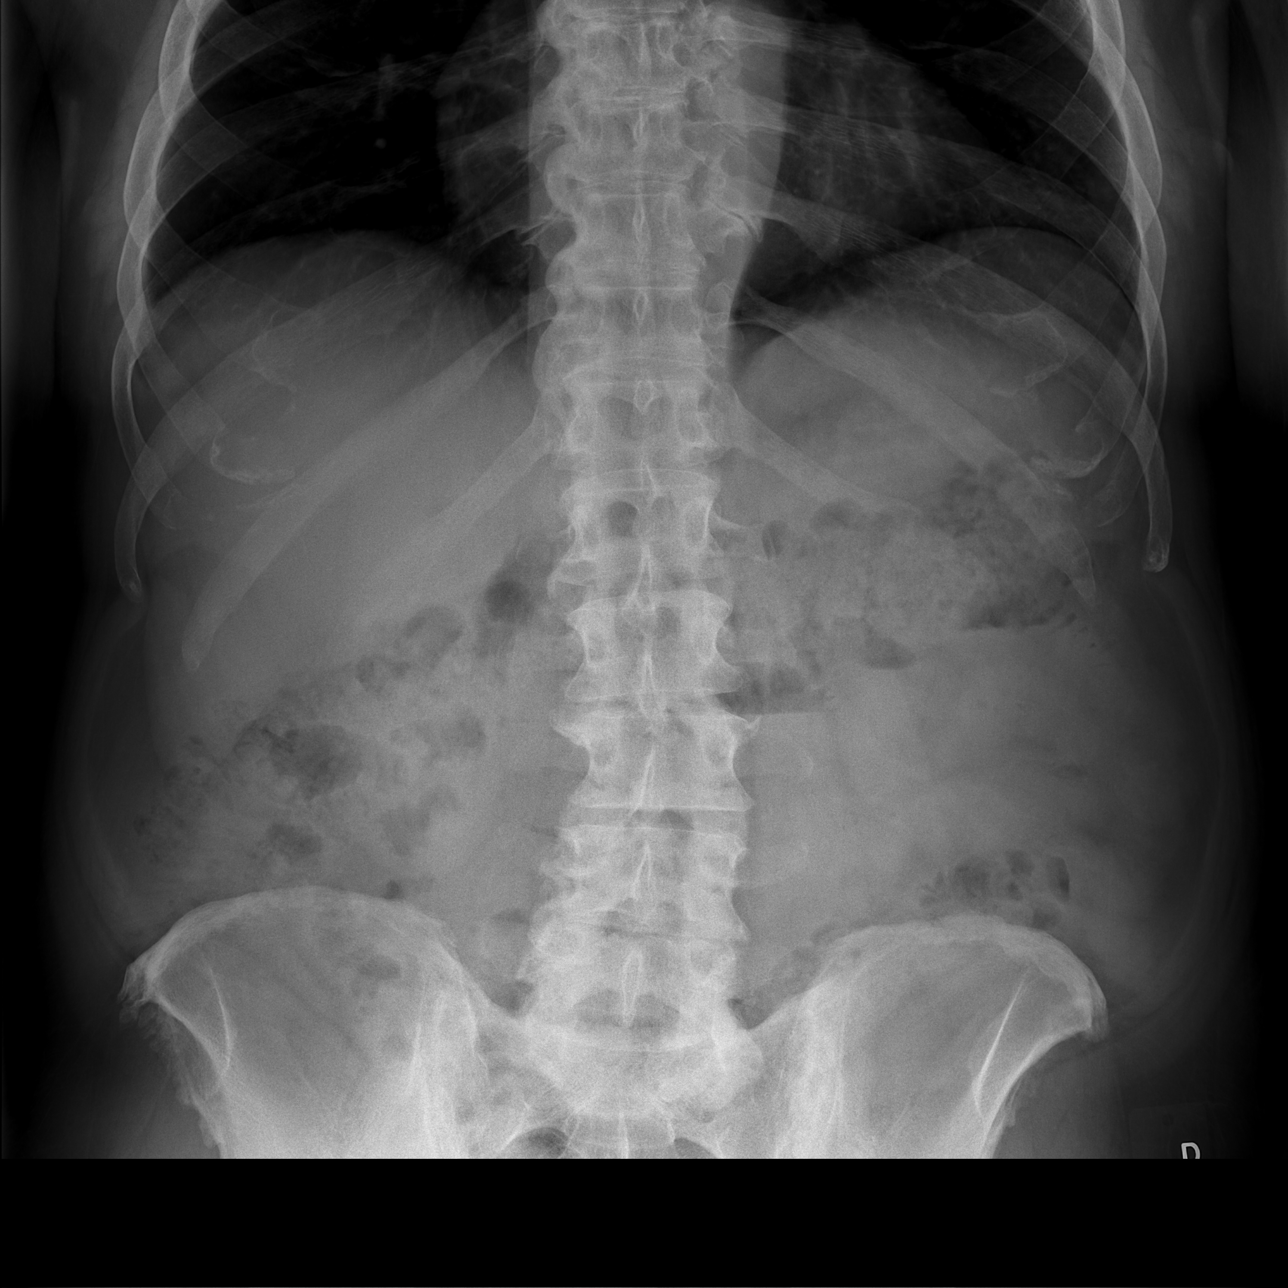

[t abdomen supine]
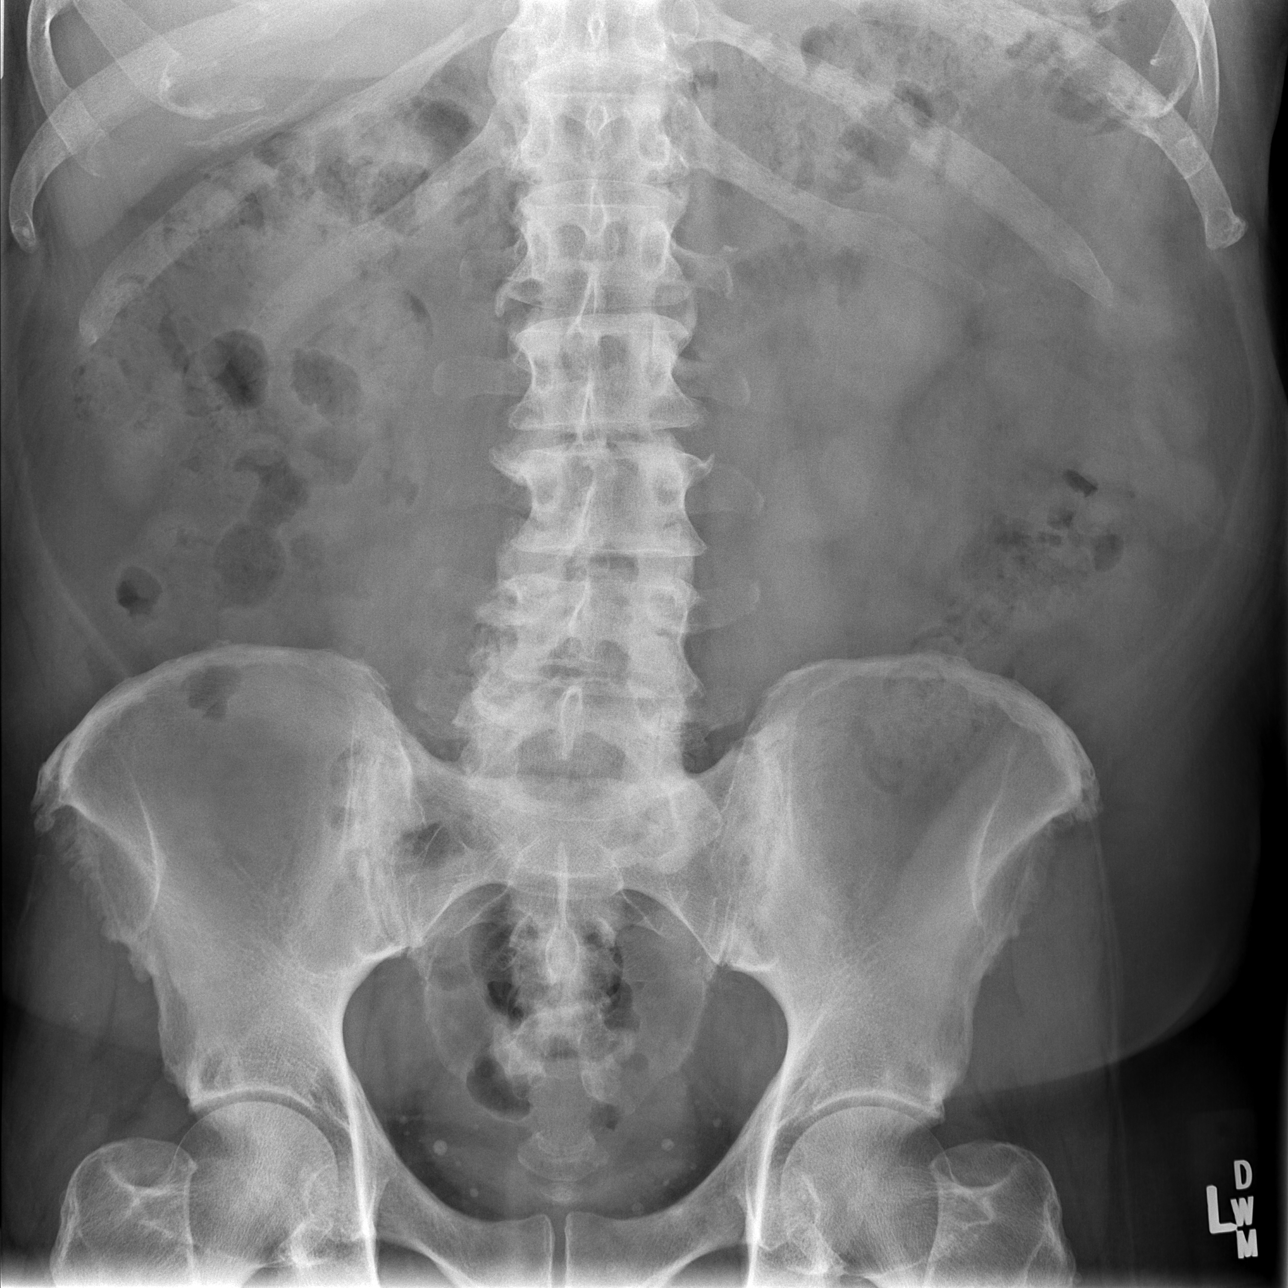

[3 of 3 positions shown; findings below may reference images not displayed]

FINDINGS: Grossly unchanged cardiac silhouette and mediastinal contours.
Improved inspiratory effort with minimal bibasilar heterogeneous
opacities, left greater than right, likely atelectasis. No new focal
airspace opacities. No pleural effusion or pneumothorax.

Moderate colonic stool burden without evidence of enteric
obstruction. No pneumoperitoneum, pneumatosis or portal venous gas.

Multiple phleboliths overlie the lower pelvis bilaterally. No
definite abnormal intra-abdominal calcifications.

Mild scoliotic curvature of the thoracolumbar spine with associated
mild to moderate multilevel DDD. Stigmata of DISH within the
thoracic spine.
IMPRESSION: 1. Moderate colonic stool burden without evidence of enteric
obstruction.
2. Minimal bibasilar atelectasis without acute cardiopulmonary
disease.

## 2023-02-16 ENCOUNTER — Other Ambulatory Visit: Payer: Self-pay

## 2023-02-16 ENCOUNTER — Emergency Department (HOSPITAL_BASED_OUTPATIENT_CLINIC_OR_DEPARTMENT_OTHER)
Admission: EM | Admit: 2023-02-16 | Discharge: 2023-02-17 | Disposition: A | Discharge status: Home / Self Care | Payer: Medicare (Managed Care) | Attending: Emergency Medicine | Admitting: Emergency Medicine

## 2023-02-16 DIAGNOSIS — Z7982 Long term (current) use of aspirin: Secondary | ICD-10-CM | POA: Insufficient documentation

## 2023-02-16 DIAGNOSIS — Z85850 Personal history of malignant neoplasm of thyroid: Secondary | ICD-10-CM | POA: Diagnosis not present

## 2023-02-16 DIAGNOSIS — Z7984 Long term (current) use of oral hypoglycemic drugs: Secondary | ICD-10-CM | POA: Insufficient documentation

## 2023-02-16 DIAGNOSIS — Z79899 Other long term (current) drug therapy: Secondary | ICD-10-CM | POA: Insufficient documentation

## 2023-02-16 DIAGNOSIS — R1013 Epigastric pain: Secondary | ICD-10-CM | POA: Diagnosis not present

## 2023-02-16 DIAGNOSIS — I10 Essential (primary) hypertension: Secondary | ICD-10-CM | POA: Diagnosis not present

## 2023-02-16 DIAGNOSIS — Z7901 Long term (current) use of anticoagulants: Secondary | ICD-10-CM | POA: Insufficient documentation

## 2023-02-16 DIAGNOSIS — I2510 Atherosclerotic heart disease of native coronary artery without angina pectoris: Secondary | ICD-10-CM | POA: Diagnosis not present

## 2023-02-16 DIAGNOSIS — E119 Type 2 diabetes mellitus without complications: Secondary | ICD-10-CM | POA: Diagnosis not present

## 2023-02-16 NOTE — ED Triage Notes (Addendum)
Patient here with chronic pancreatitis, states that was vomiting yesterday.  He took a nap and felt better.  He went to see his doctor today.  He was sent to [facility] and waited 4 hours.  He came here due to the wait.  Labs and CT were done at [facility].  Patient continues with nausea.

## 2023-02-17 ENCOUNTER — Encounter (HOSPITAL_BASED_OUTPATIENT_CLINIC_OR_DEPARTMENT_OTHER): Payer: Self-pay

## 2023-02-17 MED ORDER — SODIUM CHLORIDE 0.9 % IV BOLUS
1000.0000 mL | Freq: Once | INTRAVENOUS | Status: AC
  Administered 2023-02-17: 1000 mL via INTRAVENOUS

## 2023-02-17 MED ORDER — ONDANSETRON HCL 4 MG/2ML IJ SOLN
4.0000 mg | Freq: Once | INTRAMUSCULAR | Status: AC
  Administered 2023-02-17: 4 mg via INTRAVENOUS
  Filled 2023-02-17: qty 2

## 2023-02-17 MED ORDER — KETOROLAC TROMETHAMINE 15 MG/ML IJ SOLN
15.0000 mg | Freq: Once | INTRAMUSCULAR | Status: AC
  Administered 2023-02-17: 15 mg via INTRAVENOUS
  Filled 2023-02-17: qty 1

## 2023-02-17 NOTE — ED Provider Notes (Signed)
[place] [facility] Provider Note   CSN: [id number] Arrival date & time: 02/16/23  2054     History  Chief Complaint  Patient presents with   Abdominal Pain    Robert Meadows is a 72 y.o. male.  The history is provided by the patient and the spouse.  Patient with multiple medical conditions including chronic back pain, chronic pancreatitis, CAD, diabetes, hypertension presents with abdominal pain.  Patient reports he had recent onset of epigastric abdominal pain with associated nausea and vomiting.  He reports this feels similar to prior episodes of chronic pancreatitis.  Denies alcohol abuse. He saw his PCP on July 12.  He was directed to the emergency department due to persistent pain and low blood pressure. He went to an outside hospital and obtain labs and CT imaging, but left due to wait time.  He is no longer vomiting.  He does report continued upper abdominal pain and nausea.  He has had nonbloody emesis.  No bloody stools, no diarrhea.  No chest pain or shortness of breath is reported. Pain meds at home including fentanyl patches as well as gabapentin    Past Medical History:  Diagnosis Date   Chronic back pain    Diabetes mellitus without complication (HCC)    Hypertension    Myocardial infarct (HCC)    Pancreatitis    Stroke [place])    multiple in sept and oct 2013   Thyroid cancer (HCC)     Home Medications Prior to Admission medications   Medication Sig Start Date End Date Taking? Authorizing Provider  amLODipine (NORVASC) 10 MG tablet Take 10 mg by mouth every morning.     [provider]  aspirin 325 MG EC tablet Take 81 mg by mouth every morning.     [provider]  atenolol (TENORMIN) 100 MG tablet Take 100 mg by mouth every morning.     [provider]  atorvastatin (LIPITOR) 20 MG tablet Take 20 mg by mouth at bedtime.      [provider]  baclofen (LIORESAL) 10 MG tablet Take 1  tablet (10 mg total) by mouth 3 (three) times daily. 11/28/13   Triplett, Tammy, PA-C  bisacodyl (DULCOLAX) 5 MG EC tablet Take 1 tablet (5 mg total) by mouth daily. 12/19/14   Toy Cookey, MD  clopidogrel (PLAVIX) 75 MG tablet Take 75 mg by mouth every morning.     [provider]  cyclobenzaprine (FLEXERIL) 10 MG tablet Take 1 tablet (10 mg total) by mouth 2 (two) times daily as needed for muscle spasms. 05/08/15   Gwyneth Sprout, MD  cyclobenzaprine (FLEXERIL) 10 MG tablet Take 1 tablet (10 mg total) by mouth 2 (two) times daily as needed for muscle spasms. 12/11/15   Barrett, Rolm Gala, PA-C  Diazepam (VALIUM PO) Take by mouth.    [provider]  esomeprazole (NEXIUM) 40 MG capsule Take 40 mg by mouth daily before breakfast.      [provider]  fentaNYL (DURAGESIC - DOSED MCG/HR) 75 MCG/HR Place 1 patch onto the skin every 3 (three) days.    [provider]  furosemide (LASIX) 40 MG tablet Take 40 mg by mouth every morning.     [provider]  gabapentin (NEURONTIN) 600 MG tablet Take 900 mg by mouth 3 (three) times daily as needed. Nerve pain    [provider]  HYDROcodone-acetaminophen (NORCO) 5-325 MG tablet Take 1-2 tablets by mouth every 6 (six) hours  as needed. 12/22/15   Geoffery Lyons, MD  hydrocortisone 2.5 % cream Apply topically 2 (two) times daily as needed. Hemorids     [provider]  isosorbide mononitrate (IMDUR) 30 MG 24 hr tablet Take 15 mg by mouth every morning.     [provider]  levothyroxine (SYNTHROID, LEVOTHROID) 300 MCG tablet Take 300 mcg by mouth daily.      [provider]  metFORMIN (GLUCOPHAGE) 500 MG tablet Take 500 mg by mouth 2 (two) times daily with a meal.    [provider]  nitroGLYCERIN (NITROSTAT) 0.4 MG SL tablet Place 0.4 mg under the tongue every 5 (five) minutes x 3 doses as needed. Chest pain    [provider]  ondansetron (ZOFRAN) 4 MG tablet Take 4  mg by mouth every 8 (eight) hours as needed for nausea.    [provider]  ondansetron (ZOFRAN) 4 MG tablet Take 1 tablet (4 mg total) by mouth every 6 (six) hours. 09/18/13   Rancour, Jeannett Senior, MD  ondansetron (ZOFRAN-ODT) 8 MG disintegrating tablet Take 0.5-1 tablets (4-8 mg total) by mouth every 8 (eight) hours as needed for nausea or vomiting. 4mg  ODT q4 hours prn nausea/vomit 11/25/14   Harris, Abigail, PA-C  Pancrelipase, Lip-Prot-Amyl, (ZENPEP) 5000 UNITS CPEP Take 3 capsules by mouth at bedtime.      [provider]  promethazine (PHENERGAN) 25 MG suppository Place 1 suppository (25 mg total) rectally every 6 (six) hours as needed for nausea. 10/17/14   Derwood Kaplan, MD  SERTRALINE HCL PO Take by mouth at bedtime.    [provider]      Allergies    Ivp dye [iodinated contrast media] and Spinach    Review of Systems   Review of Systems  Constitutional:  Negative for fever.  Cardiovascular:  Negative for chest pain.  Gastrointestinal:  Positive for abdominal pain and nausea. Negative for blood in stool.    Physical Exam Updated Vital Signs BP 115/68   Pulse (!) 54   Temp 98.2 F (36.8 C) (Oral)   Resp 15   SpO2 100%  Physical Exam CONSTITUTIONAL: Elderly, no acute distress HEAD: Normocephalic/atraumatic EYES: EOMI ENMT: Mucous membranes moist NECK: supple no meningeal signs CV: S1/S2 noted, no murmurs/rubs/gallops noted LUNGS: Lungs are clear to auscultation bilaterally, no apparent distress ABDOMEN: soft, moderate epigastric tenderness, no rebound or guarding, bowel sounds noted throughout abdomen GU:no cva tenderness NEURO: Pt is awake/alert/appropriate, moves all extremitiesx4.  No facial droop.   EXTREMITIES: pulses normal/equal, full ROM SKIN: warm, color normal PSYCH: no abnormalities of mood noted, alert and oriented to situation  ED Results / Procedures / Treatments   Labs (all labs ordered are listed, but only abnormal results are  displayed) Labs Reviewed - No data to display  EKG EKG Interpretation Date/Time:  Saturday February 17 2023 00:39:21 EDT Ventricular Rate:  57 PR Interval:  148 QRS Duration:  143 QT Interval:  442 QTC Calculation: 431 R Axis:   0  Text Interpretation: Sinus rhythm Right bundle branch block Baseline wander in lead(s) V1 Confirmed by Zadie Rhine ([id number]) on 02/17/2023 12:55:15 AM  Radiology No results found.  Procedures Procedures    Medications Ordered in ED Medications  ondansetron (ZOFRAN) injection 4 mg (4 mg Intravenous Given 02/17/23 0050)  ketorolac (TORADOL) 15 MG/ML injection 15 mg (15 mg Intravenous Given 02/17/23 0050)  sodium chloride 0.9 % bolus 1,000 mL (0 mLs Intravenous Stopped 02/17/23 0147)    ED Course/ Medical  Decision Making/ A&P Clinical Course as of 02/17/23 0154  Sat Feb 17, 2023  1610 Patient improved, resting comfortably.  He is taking oral fluids.  Blood pressure is improving. Reports pain is resolving. He is already had extensive workup at a local hospital within the past 24 hours.  No indication for repeat imaging. Discussed need for follow-up PCP next week if no improvement [DW]    Clinical Course User Index [DW] Zadie Rhine, MD                             Medical Decision Making Amount and/or Complexity of Data Reviewed ECG/medicine tests: ordered.  Risk Prescription drug management.   This patient presents to the ED for concern of abdominal pain, this involves an extensive number of treatment options, and is a complaint that carries with it a high risk of complications and morbidity.  The differential diagnosis includes but is not limited to cholecystitis, cholelithiasis, pancreatitis, gastritis, peptic ulcer disease, appendicitis, bowel obstruction, bowel perforation, diverticulitis, AAA, ischemic bowel, acute coronary syndrome    Comorbidities that complicate the patient evaluation: Patient's presentation is complicated by their  history of chronic pancreatitis, CAD  Additional history obtained: Additional history obtained from family Records reviewed Care Everywhere/External Records  Lab Tests: I Ordered, and personally interpreted labs.  The pertinent results include: Labs from outside facility reviewed  Imaging Studies ordered: CT results/report from outside hospital reviewed that are unremarkable  Medicines ordered and prescription drug management: I ordered medication including Toradol for pain Reevaluation of the patient after these medicines showed that the patient    improved   Reevaluation: After the interventions noted above, I reevaluated the patient and found that they have :improved  Complexity of problems addressed: Patient's presentation is most consistent with  acute presentation with potential threat to life or bodily function  Disposition: After consideration of the diagnostic results and the patient's response to treatment,  I feel that the patent would benefit from discharge   .           Final Clinical Impression(s) / ED Diagnoses Final diagnoses:  Epigastric pain    Rx / DC Orders ED Discharge Orders     None         Zadie Rhine, MD 02/17/23 9604

## 2023-02-17 NOTE — Discharge Instructions (Signed)

## 2023-02-18 ENCOUNTER — Emergency Department (HOSPITAL_BASED_OUTPATIENT_CLINIC_OR_DEPARTMENT_OTHER): Payer: Medicare (Managed Care)

## 2023-02-18 ENCOUNTER — Emergency Department (HOSPITAL_BASED_OUTPATIENT_CLINIC_OR_DEPARTMENT_OTHER)
Admission: EM | Admit: 2023-02-18 | Discharge: 2023-02-18 | Disposition: A | Discharge status: Home / Self Care | Payer: Medicare (Managed Care) | Attending: Emergency Medicine | Admitting: Emergency Medicine

## 2023-02-18 ENCOUNTER — Encounter (HOSPITAL_BASED_OUTPATIENT_CLINIC_OR_DEPARTMENT_OTHER): Payer: Self-pay

## 2023-02-18 DIAGNOSIS — R1013 Epigastric pain: Secondary | ICD-10-CM | POA: Insufficient documentation

## 2023-02-18 DIAGNOSIS — I10 Essential (primary) hypertension: Secondary | ICD-10-CM | POA: Diagnosis not present

## 2023-02-18 DIAGNOSIS — Z7982 Long term (current) use of aspirin: Secondary | ICD-10-CM | POA: Diagnosis not present

## 2023-02-18 DIAGNOSIS — E119 Type 2 diabetes mellitus without complications: Secondary | ICD-10-CM | POA: Insufficient documentation

## 2023-02-18 DIAGNOSIS — Z79899 Other long term (current) drug therapy: Secondary | ICD-10-CM | POA: Insufficient documentation

## 2023-02-18 DIAGNOSIS — R531 Weakness: Secondary | ICD-10-CM | POA: Diagnosis not present

## 2023-02-18 LAB — BASIC METABOLIC PANEL
Anion gap: 7 (ref 5–15)
BUN: 11 mg/dL (ref 8–23)
CO2: 29 mmol/L (ref 22–32)
Calcium: 8.5 mg/dL — ABNORMAL LOW (ref 8.9–10.3)
Chloride: 103 mmol/L (ref 98–111)
Creatinine, Ser: 1.22 mg/dL (ref 0.61–1.24)
GFR, Estimated: >60 mL/min (ref >60)
Glucose, Bld: 110 mg/dL — ABNORMAL HIGH (ref 70–99)
Potassium: 3.9 mmol/L (ref 3.5–5.1)
Sodium: 139 mmol/L (ref 135–145)

## 2023-02-18 LAB — CBC
HCT: 38.4 % — ABNORMAL LOW (ref 39.0–52.0)
Hemoglobin: 12.1 g/dL — ABNORMAL LOW (ref 13.0–17.0)
MCH: 24.1 pg — ABNORMAL LOW (ref 26.0–34.0)
MCHC: 31.5 g/dL (ref 30.0–36.0)
MCV: 76.5 fL — ABNORMAL LOW (ref 80.0–100.0)
Platelets: 169 10*3/uL (ref 150–400)
RBC: 5.02 MIL/uL (ref 4.22–5.81)
RDW: 14.9 % (ref 11.5–15.5)
WBC: 3.2 10*3/uL — ABNORMAL LOW (ref 4.0–10.5)
nRBC: 0 % (ref 0.0–0.2)

## 2023-02-18 LAB — URINALYSIS, MICROSCOPIC (REFLEX)

## 2023-02-18 LAB — URINALYSIS, ROUTINE W REFLEX MICROSCOPIC
Bilirubin Urine: NEGATIVE
Glucose, UA: NEGATIVE mg/dL
Ketones, ur: NEGATIVE mg/dL
Leukocytes,Ua: NEGATIVE
Nitrite: NEGATIVE
Protein, ur: NEGATIVE mg/dL
Specific Gravity, Urine: 1.015 (ref 1.005–1.030)
pH: 6.5 (ref 5.0–8.0)

## 2023-02-18 LAB — CBG MONITORING, ED: Glucose-Capillary: 102 mg/dL — ABNORMAL HIGH (ref 70–99)

## 2023-02-18 LAB — LIPASE, BLOOD: Lipase: 30 U/L (ref 11–51)

## 2023-02-18 MED ORDER — LACTATED RINGERS IV BOLUS
1000.0000 mL | Freq: Once | INTRAVENOUS | Status: AC
  Administered 2023-02-18: 1000 mL via INTRAVENOUS

## 2023-02-18 NOTE — ED Notes (Signed)

## 2023-02-18 NOTE — ED Provider Notes (Signed)
[place] [facility] Provider Note   CSN: [id number] Arrival date & time: 02/18/23  1245     History  Chief Complaint  Patient presents with   Weakness    Zhane Bluitt is a 72 y.o. male with medical history of diabetes, chronic back pain, pancreatitis, hypertension.  Patient presents to ED for evaluation of abdominal pain.  Patient was seen in this ED on 7/12, 2 days ago, for similar complaint.  Patient had unremarkable workup and was sent home at this time after receiving IV fluids.  The patient was initially seen on that day at an outside facility, [facility], where he received lab work as well as CT scan of his abdomen pelvis without contrast.  CT scan was unremarkable as well as lab work however patient did not stay for full evaluation and came to this ED instead.  He was sent home at night.  He reports that ever since Friday night, he felt fine upon arrival home but then that morning on Saturday woke up and had generalized weakness again.  He states that the generalized weakness has persisted since Saturday.  He states that he has only eaten 1 bag of cheese its since Saturday.  He denies any alcohol use, states he has never drink before.  He reports that his weakness has been persistent and this morning he was unable to even get up to use the bathroom because he was so weak.  He denies nausea, vomiting, fevers, diarrhea, chest pain, shortness of breath, sore throat, cough or bodyaches or chills.  The patient reports that his home fentanyl patches as well as gabapentin are not relieving his pain.  He denies having a GI doctor.  He states that ever since being discharged on Friday he is only drinking Sprite because he does not like water.   Weakness Associated symptoms: abdominal pain   Associated symptoms: no chest pain, no diarrhea, no dizziness, no dysuria, no fever, no nausea, no shortness of breath and no vomiting        Home  Medications Prior to Admission medications   Medication Sig Start Date End Date Taking? Authorizing Provider  amLODipine (NORVASC) 10 MG tablet Take 10 mg by mouth every morning.     [provider]  aspirin 325 MG EC tablet Take 81 mg by mouth every morning.     [provider]  atenolol (TENORMIN) 100 MG tablet Take 100 mg by mouth every morning.     [provider]  atorvastatin (LIPITOR) 20 MG tablet Take 20 mg by mouth at bedtime.      [provider]  baclofen (LIORESAL) 10 MG tablet Take 1 tablet (10 mg total) by mouth 3 (three) times daily. 11/28/13   Triplett, Tammy, PA-C  bisacodyl (DULCOLAX) 5 MG EC tablet Take 1 tablet (5 mg total) by mouth daily. 12/19/14   Toy Cookey, MD  clopidogrel (PLAVIX) 75 MG tablet Take 75 mg by mouth every morning.     [provider]  cyclobenzaprine (FLEXERIL) 10 MG tablet Take 1 tablet (10 mg total) by mouth 2 (two) times daily as needed for muscle spasms. 05/08/15   Gwyneth Sprout, MD  cyclobenzaprine (FLEXERIL) 10 MG tablet Take 1 tablet (10 mg total) by mouth 2 (two) times daily as needed for muscle spasms. 12/11/15   Barrett, Rolm Gala, PA-C  Diazepam (VALIUM PO) Take by mouth.    [provider]  esomeprazole (NEXIUM) 40 MG capsule Take  40 mg by mouth daily before breakfast.      [provider]  fentaNYL (DURAGESIC - DOSED MCG/HR) 75 MCG/HR Place 1 patch onto the skin every 3 (three) days.    [provider]  furosemide (LASIX) 40 MG tablet Take 40 mg by mouth every morning.     [provider]  gabapentin (NEURONTIN) 600 MG tablet Take 900 mg by mouth 3 (three) times daily as needed. Nerve pain    [provider]  HYDROcodone-acetaminophen (NORCO) 5-325 MG tablet Take 1-2 tablets by mouth every 6 (six) hours as needed. 12/22/15   Geoffery Lyons, MD  hydrocortisone 2.5 % cream Apply topically 2 (two) times daily as needed. Hemorids     [provider]   isosorbide mononitrate (IMDUR) 30 MG 24 hr tablet Take 15 mg by mouth every morning.     [provider]  levothyroxine (SYNTHROID, LEVOTHROID) 300 MCG tablet Take 300 mcg by mouth daily.      [provider]  metFORMIN (GLUCOPHAGE) 500 MG tablet Take 500 mg by mouth 2 (two) times daily with a meal.    [provider]  nitroGLYCERIN (NITROSTAT) 0.4 MG SL tablet Place 0.4 mg under the tongue every 5 (five) minutes x 3 doses as needed. Chest pain    [provider]  ondansetron (ZOFRAN) 4 MG tablet Take 4 mg by mouth every 8 (eight) hours as needed for nausea.    [provider]  ondansetron (ZOFRAN) 4 MG tablet Take 1 tablet (4 mg total) by mouth every 6 (six) hours. 09/18/13   Rancour, Jeannett Senior, MD  ondansetron (ZOFRAN-ODT) 8 MG disintegrating tablet Take 0.5-1 tablets (4-8 mg total) by mouth every 8 (eight) hours as needed for nausea or vomiting. 4mg  ODT q4 hours prn nausea/vomit 11/25/14   Harris, Abigail, PA-C  Pancrelipase, Lip-Prot-Amyl, (ZENPEP) 5000 UNITS CPEP Take 3 capsules by mouth at bedtime.      [provider]  promethazine (PHENERGAN) 25 MG suppository Place 1 suppository (25 mg total) rectally every 6 (six) hours as needed for nausea. 10/17/14   Derwood Kaplan, MD  SERTRALINE HCL PO Take by mouth at bedtime.    [provider]      Allergies    Ivp dye [iodinated contrast media] and Spinach    Review of Systems   Review of Systems  Constitutional:  Negative for fever.  Respiratory:  Negative for shortness of breath.   Cardiovascular:  Negative for chest pain.  Gastrointestinal:  Positive for abdominal pain. Negative for diarrhea, nausea and vomiting.  Genitourinary:  Negative for dysuria.  Neurological:  Positive for weakness. Negative for dizziness, syncope and numbness.  All other systems reviewed and are negative.   Physical Exam Updated Vital Signs BP 128/68   Pulse (!) 55   Temp 98 F (36.7 C) (Oral)    Resp 20   Wt 77.6 kg   SpO2 99%   BMI 22.56 kg/m  Physical Exam Vitals and nursing note reviewed.  Constitutional:      General: He is not in acute distress.    Appearance: Normal appearance. He is not ill-appearing, toxic-appearing or diaphoretic.  HENT:     Head: Normocephalic and atraumatic.     Nose: Nose normal.     Mouth/Throat:     Mouth: Mucous membranes are moist.     Pharynx: Oropharynx is clear.  Eyes:     Extraocular Movements: Extraocular movements intact.     Conjunctiva/sclera: Conjunctivae normal.  Pupils: Pupils are equal, round, and reactive to light.  Cardiovascular:     Rate and Rhythm: Normal rate and regular rhythm.  Pulmonary:     Effort: Pulmonary effort is normal.     Breath sounds: Normal breath sounds. No wheezing.  Abdominal:     General: Abdomen is flat. Bowel sounds are normal.     Palpations: Abdomen is soft.     Tenderness: There is abdominal tenderness in the epigastric area.  Musculoskeletal:     Cervical back: Normal range of motion and neck supple. No tenderness.  Skin:    General: Skin is warm and dry.     Capillary Refill: Capillary refill takes less than 2 seconds.  Neurological:     General: No focal deficit present.     Mental Status: He is alert and oriented to person, place, and time.     GCS: GCS eye subscore is 4. GCS verbal subscore is 5. GCS motor subscore is 6.     Cranial Nerves: Cranial nerves 2-12 are intact. No cranial nerve deficit.     Sensory: Sensation is intact. No sensory deficit.     Motor: Motor function is intact. No weakness.     Coordination: Coordination is intact. [facility]el [facility] Test normal.     Comments: Equal strength, 5 out of 5, to bilateral lower extremities.  Equal strength bilateral upper extremities 5 out of 5.  No pronator drift, no slurred speech, no facial droop.  CN II through XII intact.     ED Results / Procedures / Treatments   Labs (all labs ordered are listed, but only abnormal results  are displayed) Labs Reviewed  BASIC METABOLIC PANEL - Abnormal; Notable for the following components:      Result Value   Glucose, Bld 110 (*)    Calcium 8.5 (*)    All other components within normal limits  CBC - Abnormal; Notable for the following components:   WBC 3.2 (*)    Hemoglobin 12.1 (*)    HCT 38.4 (*)    MCV 76.5 (*)    MCH 24.1 (*)    All other components within normal limits  URINALYSIS, ROUTINE W REFLEX MICROSCOPIC - Abnormal; Notable for the following components:   Hgb urine dipstick SMALL (*)    All other components within normal limits  URINALYSIS, MICROSCOPIC (REFLEX) - Abnormal; Notable for the following components:   Bacteria, UA FEW (*)    All other components within normal limits  CBG MONITORING, ED - Abnormal; Notable for the following components:   Glucose-Capillary 102 (*)    All other components within normal limits  LIPASE, BLOOD    EKG None  Radiology CT ABDOMEN PELVIS WO CONTRAST  Result Date: 02/18/2023 CLINICAL DATA:  Acute severe pancreatitis. EXAM: CT ABDOMEN AND PELVIS WITHOUT CONTRAST TECHNIQUE: Multidetector CT imaging of the abdomen and pelvis was performed following the standard protocol without IV contrast. RADIATION DOSE REDUCTION: This exam was performed according to the departmental dose-optimization program which includes automated exposure control, adjustment of the mA and/or kV according to patient size and/or use of iterative reconstruction technique. COMPARISON:  February 16, 2023 FINDINGS: Lower chest: No acute abnormality. Hepatobiliary: No focal liver abnormality is seen. No gallstones, gallbladder wall thickening, or biliary dilatation. Pancreas: No significant peripancreatic inflammatory changes or pancreatic ductal dilation. Spleen: Normal in size without focal abnormality. Adrenals/Urinary Tract: Adrenal glands are unremarkable. Kidneys are normal, without renal calculi, focal lesion, or hydronephrosis. Bladder is unremarkable.  Stomach/Bowel:  Diffuse mucosal thickening of most of the colon. No evidence of small-bowel obstruction. Normal appendix. Vascular/Lymphatic: Aortic atherosclerosis. No enlarged abdominal or pelvic lymph nodes. Reproductive: Mild enlargement of the prostate gland, which measures 5.3 cm transversely. Other: No abdominal wall hernia or abnormality. No abdominopelvic ascites. Musculoskeletal: Spondylosis of the thoracic spine. IMPRESSION: 1. Diffuse mucosal thickening of most of the colon, suggestive of colitis. 2. No significant peripancreatic inflammatory changes or pancreatic ductal dilation. 3. Mild enlargement of the prostate gland. 4. Aortic atherosclerosis. Aortic Atherosclerosis (ICD10-I70.0). Electronically Signed   By: Ted Mcalpine M.D.   On: 02/18/2023 15:24    Procedures Procedures   Medications Ordered in ED Medications  lactated ringers bolus 1,000 mL (0 mLs Intravenous Stopped 02/18/23 1522)  lactated ringers bolus 1,000 mL (0 mLs Intravenous Stopped 02/18/23 1646)    ED Course/ Medical Decision Making/ A&P  Medical Decision Making Amount and/or Complexity of Data Reviewed Labs: ordered. Radiology: ordered.   72 year old male presents to the ED for evaluation abdominal pain, weakness.  Please see HPI for further details.  On my examination the patient is afebrile and nontachycardic.  His lung sounds are clear bilaterally and he is not hypoxic.  His abdomen is soft and compressible throughout however he does endorse epigastric abdominal pain, there is no overlying skin change.  Neurological examination at baseline.  Patient CBC shows no leukocytosis, baseline hemoglobin of 12.1.  The patient metabolic panel has no electrolyte derangement, no elevated creatinine, anion gap of 7.  The patient urinalysis shows small hemoglobin however no evidence of infection.  The patient lipase is within normal limits at 30.  The patient CT scan of abdomen pelvis shows no evidence of acute  pancreatitis.  There is suggestion of possible colitis but patient denies any recent diarrhea so clinically not consistent.  Patient given 2 L LR.  After 2 L LR, the patient reports he feels much better.  Patient and patient wife at bedside reports that the patient does not drink water.  I have advised the patient that he will need to increase his water intake.  Patient reports he feels much better at this time after 2 L of LR.  He has required no pain medication while he has been here. He is requesting discharge and he states that he feels comfortable going home.  The patient was advised to follow-up with his PCP this week.  He was advised to return to the ED with any new or worsening signs or symptoms and he voiced understanding.  He all of his questions answered to his satisfaction.  He is stable to discharge at this time.  Final Clinical Impression(s) / ED Diagnoses Final diagnoses:  Epigastric pain    Rx / DC Orders ED Discharge Orders     None         Al Decant, PA-C 02/18/23 1724    Sloan Leiter, DO 02/20/23 [phone]

## 2023-02-18 NOTE — Discharge Instructions (Addendum)
It was a pleasure taking part in your care today.  As discussed, the CT scan of your abdomen and pelvis did not show any evidence of acute pancreatitis.  Your lipase was not elevated.  I would like for you to increase the amount of water you are taking as I believe that this is contributing to your generalized weakness.  I would like for you to continue taking pain medication as needed at home.  I would also like you to follow-up with your PCP this week for further management and reevaluation.  If you develop any new or worsening symptoms at home, please return to the ED for further management.

## 2023-02-18 NOTE — ED Triage Notes (Signed)
Pt seen on Friday for chronic pancreatitis. Weakness has gotten worse since he was last seen. Denies N,V,D

## 2024-07-07 ENCOUNTER — Other Ambulatory Visit: Payer: Self-pay

## 2024-07-07 ENCOUNTER — Encounter (HOSPITAL_BASED_OUTPATIENT_CLINIC_OR_DEPARTMENT_OTHER): Payer: Self-pay | Admitting: Emergency Medicine

## 2024-07-07 DIAGNOSIS — Z5321 Procedure and treatment not carried out due to patient leaving prior to being seen by health care provider: Secondary | ICD-10-CM | POA: Insufficient documentation

## 2024-07-07 DIAGNOSIS — R1110 Vomiting, unspecified: Secondary | ICD-10-CM | POA: Insufficient documentation

## 2024-07-07 DIAGNOSIS — R1012 Left upper quadrant pain: Secondary | ICD-10-CM | POA: Diagnosis present

## 2024-07-07 LAB — CBC
HCT: 37.8 % — ABNORMAL LOW (ref 39.0–52.0)
Hemoglobin: 11.7 g/dL — ABNORMAL LOW (ref 13.0–17.0)
MCH: 24 pg — ABNORMAL LOW (ref 26.0–34.0)
MCHC: 31 g/dL (ref 30.0–36.0)
MCV: 77.6 fL — ABNORMAL LOW (ref 80.0–100.0)
Platelets: 165 K/uL (ref 150–400)
RBC: 4.87 MIL/uL (ref 4.22–5.81)
RDW: 14.6 % (ref 11.5–15.5)
WBC: 3.6 K/uL — ABNORMAL LOW (ref 4.0–10.5)
nRBC: 0 % (ref 0.0–0.2)

## 2024-07-07 LAB — COMPREHENSIVE METABOLIC PANEL WITH GFR
ALT: 36 U/L (ref 0–44)
AST: 47 U/L — ABNORMAL HIGH (ref 15–41)
Albumin: 4.1 g/dL (ref 3.5–5.0)
Alkaline Phosphatase: 58 U/L (ref 38–126)
Anion gap: 8 (ref 5–15)
BUN: 15 mg/dL (ref 8–23)
CO2: 30 mmol/L (ref 22–32)
Calcium: 8.6 mg/dL — ABNORMAL LOW (ref 8.9–10.3)
Chloride: 103 mmol/L (ref 98–111)
Creatinine, Ser: 1.44 mg/dL — ABNORMAL HIGH (ref 0.61–1.24)
GFR, Estimated: 52 mL/min — ABNORMAL LOW (ref >60)
Glucose, Bld: 102 mg/dL — ABNORMAL HIGH (ref 70–99)
Potassium: 4.4 mmol/L (ref 3.5–5.1)
Sodium: 141 mmol/L (ref 135–145)
Total Bilirubin: 0.3 mg/dL (ref 0.0–1.2)
Total Protein: 6.7 g/dL (ref 6.5–8.1)

## 2024-07-07 LAB — LIPASE, BLOOD: Lipase: 27 U/L (ref 11–51)

## 2024-07-07 NOTE — ED Triage Notes (Signed)
 Pt c/o LUQ abd pain, hx of chronic pancreatitis. Reports emesis since midday. Denies diarrhea, fever, known sick contacts.   LWBS at [facility] today, given zofran  prior to departure.

## 2024-07-08 ENCOUNTER — Emergency Department (HOSPITAL_BASED_OUTPATIENT_CLINIC_OR_DEPARTMENT_OTHER): Admission: EM | Admit: 2024-07-08 | Discharge: 2024-07-08 | Payer: Medicare (Managed Care)
